# Patient Record
Sex: Female | Born: 1960
Health system: Southern US, Community
[De-identification: ages and names within clinical notes are randomized; demographics above are authoritative.]

## PROBLEM LIST (undated history)

## (undated) DIAGNOSIS — N393 Stress incontinence (female) (male): Secondary | ICD-10-CM

## (undated) DIAGNOSIS — J302 Other seasonal allergic rhinitis: Secondary | ICD-10-CM

## (undated) DIAGNOSIS — M1990 Unspecified osteoarthritis, unspecified site: Secondary | ICD-10-CM

## (undated) DIAGNOSIS — I10 Essential (primary) hypertension: Secondary | ICD-10-CM

## (undated) DIAGNOSIS — K219 Gastro-esophageal reflux disease without esophagitis: Secondary | ICD-10-CM

## (undated) HISTORY — PX: WISDOM TOOTH EXTRACTION: SHX21

## (undated) HISTORY — PX: CHOLECYSTECTOMY: SHX55

## (undated) HISTORY — PX: KNEE SURGERY: SHX244

## (undated) HISTORY — PX: CATARACT EXTRACTION, BILATERAL: SHX1313

## (undated) HISTORY — PX: NASAL SEPTUM SURGERY: SHX37

---

## 2001-09-08 ENCOUNTER — Other Ambulatory Visit: Admission: RE | Admit: 2001-09-08 | Discharge: 2001-09-08 | Payer: Self-pay | Admitting: Obstetrics & Gynecology

## 2001-09-27 ENCOUNTER — Encounter: Payer: Self-pay | Admitting: Obstetrics & Gynecology

## 2001-09-27 ENCOUNTER — Encounter: Admission: RE | Admit: 2001-09-27 | Discharge: 2001-09-27 | Payer: Self-pay | Admitting: Obstetrics & Gynecology

## 2003-06-12 ENCOUNTER — Encounter: Admission: RE | Admit: 2003-06-12 | Discharge: 2003-06-12 | Payer: Self-pay | Admitting: Obstetrics & Gynecology

## 2003-06-19 ENCOUNTER — Other Ambulatory Visit: Admission: RE | Admit: 2003-06-19 | Discharge: 2003-06-19 | Payer: Self-pay | Admitting: Obstetrics & Gynecology

## 2004-05-27 ENCOUNTER — Emergency Department: Payer: Self-pay | Admitting: Emergency Medicine

## 2005-06-16 ENCOUNTER — Encounter: Admission: RE | Admit: 2005-06-16 | Discharge: 2005-06-16 | Payer: Self-pay | Admitting: Obstetrics & Gynecology

## 2007-02-04 ENCOUNTER — Emergency Department: Payer: Self-pay | Admitting: Emergency Medicine

## 2007-05-25 ENCOUNTER — Ambulatory Visit (HOSPITAL_BASED_OUTPATIENT_CLINIC_OR_DEPARTMENT_OTHER): Admission: RE | Admit: 2007-05-25 | Discharge: 2007-05-25 | Payer: Self-pay | Admitting: Orthopedic Surgery

## 2007-06-03 ENCOUNTER — Encounter: Payer: Self-pay | Admitting: Orthopedic Surgery

## 2007-06-04 ENCOUNTER — Encounter: Payer: Self-pay | Admitting: Orthopedic Surgery

## 2007-07-04 ENCOUNTER — Encounter: Payer: Self-pay | Admitting: Orthopedic Surgery

## 2009-06-12 ENCOUNTER — Encounter: Admission: RE | Admit: 2009-06-12 | Discharge: 2009-06-12 | Payer: Self-pay | Admitting: Obstetrics & Gynecology

## 2010-12-16 NOTE — Op Note (Signed)
Karen Webster, Karen Webster              ACCOUNT NO.:  [id number]   MEDICAL RECORD NO.:  [id number]          PATIENT TYPE:  AMB   LOCATION:  [facility]                         FACILITY:  [facility]   PHYSICIAN:  Ollen Gross, M.D.    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   PROCEDURE:  Right knee arthroscopy, chondroplasty patella, lateral  retinacular release.   SURGEON:  Ollen Gross, M.D.   ASSISTANT:  None.   ANESTHESIA:  General.   ESTIMATED BLOOD LOSS:  Minimal.   DRAIN:  Hemovac x1.   COMPLICATIONS:  Stable to recovery.   CLINICAL NOTE:  Karen Webster is a 50 year old female with severe  chondromalacia patella with significant anterior knee pain.  She has had  physical therapy with slight improvement symptoms, but still has  considerable pain, crepitus and dysfunction.  She has significant  lateral tilt also.  She has failed nonoperative management and presents  now for arthroscopy, chondroplasty and lateral release.   PROCEDURE IN DETAIL:  After the successful administration of general  anesthetic, a tourniquet is placed high on the right thigh, right lower  extremity prepped and draped in the usual sterile fashion.  Standard  superomedial and inferolateral incisions made, flow cannula passed  superomedial, camera passed inferolateral.  Arthroscopic visualization  proceeds.  The undersurface of patella shows grade II and III  chondromalacia but no exposed bone.  The trochlea surprisingly looks  normal.  The medial and lateral gutters are visualized.  There are no  loose bodies.  There is some synovitis present out laterally.  Flexion  and valgus force is applied to the knee and the medial compartment is  entered.  The medial  compartment looks normal.  A spinal needle is used  to localize the inferomedial portal.  Small incision made and dilator  placed.  It is probed and everything is stable in the medial  compartment.  Intercondylar notch is visualized.  The ACL is normal.  Lateral compartment is entered and it looks normal.  I then debrided the  undersurface of patella with the shaver starting at the inferomedial  portal and then we created a superolateral portal and I completed the  debridement from the superolateral portal.  We got down to stable  cartilaginous base and there was no exposed bone.  All of the frayed  cartilage had been removed down to a good stable base.  The  superolateral portal was then our starting point for the lateral  release.  I placed the ArthroCare device through that portal to debride  some of the hypertrophic synovium, then placed the ArthroCare through  the inferolateral portal to start to release, beginning at that  superolateral portal and coursing down the lateral retinaculum to the  inferolateral portal.  I released the tissue then decreased the pressure  on the inflow in order to find any bleeding tissue and cauterized  it.  Once this is completed then we removed the arthroscopic equipment from  inferior portals and superolateral portal and closed with interrupted 4-  0 nylon.  I injected 20 mL of 25% Marcaine with epi through the inflow  cannula and then a Hemovac drain was threaded through there and the  cannula removed.  I closed the skin, but did not sew the drain in.  A  bulky sterile dressing is applied and the drain is hooked to suction.  She is then awakened and transported to recovery in stable condition.      Ollen Gross, M.D.  Electronically Signed     FA/MEDQ  D:  05/25/2007  T:  05/26/2007  Job:  [id number]

## 2011-05-13 LAB — POCT PREGNANCY, URINE
Operator id: 114531
Preg Test, Ur: NEGATIVE

## 2011-05-13 LAB — POCT I-STAT 4, (NA,K, GLUC, HGB,HCT)
Glucose, Bld: 99
HCT: 41
Hemoglobin: 13.9
Operator id: 268271
Potassium: 3.3 — ABNORMAL LOW
Sodium: 139

## 2011-05-29 ENCOUNTER — Other Ambulatory Visit: Payer: Self-pay | Admitting: Obstetrics & Gynecology

## 2011-05-29 DIAGNOSIS — N6310 Unspecified lump in the right breast, unspecified quadrant: Secondary | ICD-10-CM

## 2011-06-11 ENCOUNTER — Other Ambulatory Visit: Payer: Self-pay

## 2011-06-24 ENCOUNTER — Ambulatory Visit
Admission: RE | Admit: 2011-06-24 | Discharge: 2011-06-24 | Disposition: A | Discharge status: Home / Self Care | Payer: 59 | Source: Ambulatory Visit | Attending: Obstetrics & Gynecology | Admitting: Obstetrics & Gynecology

## 2011-06-24 DIAGNOSIS — N6310 Unspecified lump in the right breast, unspecified quadrant: Secondary | ICD-10-CM

## 2011-08-10 ENCOUNTER — Emergency Department: Payer: Self-pay | Admitting: Emergency Medicine

## 2011-08-10 LAB — CBC
HGB: 12.1 g/dL (ref 12.0–16.0)
MCHC: 32.9 g/dL (ref 32.0–36.0)
Platelet: 258 10*3/uL (ref 150–440)

## 2011-08-10 LAB — BASIC METABOLIC PANEL
Calcium, Total: 8.9 mg/dL (ref 8.5–10.1)
EGFR (Non-African Amer.): >60
Glucose: 98 mg/dL (ref 65–99)
Osmolality: 278 (ref 275–301)
Potassium: 3.8 mmol/L (ref 3.5–5.1)

## 2013-01-16 ENCOUNTER — Other Ambulatory Visit: Payer: Self-pay

## 2013-01-16 DIAGNOSIS — Z1231 Encounter for screening mammogram for malignant neoplasm of breast: Secondary | ICD-10-CM

## 2013-01-17 ENCOUNTER — Ambulatory Visit
Admission: RE | Admit: 2013-01-17 | Discharge: 2013-01-17 | Disposition: A | Discharge status: Home / Self Care | Payer: 59 | Source: Ambulatory Visit

## 2013-01-17 DIAGNOSIS — Z1231 Encounter for screening mammogram for malignant neoplasm of breast: Secondary | ICD-10-CM

## 2016-01-15 ENCOUNTER — Other Ambulatory Visit: Payer: Self-pay | Admitting: Family Medicine

## 2016-01-15 DIAGNOSIS — Z1231 Encounter for screening mammogram for malignant neoplasm of breast: Secondary | ICD-10-CM

## 2016-01-22 ENCOUNTER — Ambulatory Visit: Payer: Self-pay

## 2016-01-23 ENCOUNTER — Ambulatory Visit
Admission: RE | Admit: 2016-01-23 | Discharge: 2016-01-23 | Disposition: A | Discharge status: Home / Self Care | Payer: 59 | Source: Ambulatory Visit | Attending: Family Medicine | Admitting: Family Medicine

## 2016-01-23 DIAGNOSIS — Z1231 Encounter for screening mammogram for malignant neoplasm of breast: Secondary | ICD-10-CM

## 2016-08-03 HISTORY — PX: COLONOSCOPY: SHX174

## 2016-08-24 DIAGNOSIS — J329 Chronic sinusitis, unspecified: Secondary | ICD-10-CM | POA: Diagnosis not present

## 2016-08-24 DIAGNOSIS — R05 Cough: Secondary | ICD-10-CM | POA: Diagnosis not present

## 2016-10-16 ENCOUNTER — Other Ambulatory Visit: Payer: Self-pay | Admitting: Family Medicine

## 2016-10-16 ENCOUNTER — Ambulatory Visit
Admission: RE | Admit: 2016-10-16 | Discharge: 2016-10-16 | Disposition: A | Discharge status: Home / Self Care | Payer: 59 | Source: Ambulatory Visit | Attending: Family Medicine | Admitting: Family Medicine

## 2016-10-16 DIAGNOSIS — M25522 Pain in left elbow: Secondary | ICD-10-CM

## 2016-10-16 DIAGNOSIS — W19XXXA Unspecified fall, initial encounter: Secondary | ICD-10-CM

## 2016-10-16 DIAGNOSIS — S59902A Unspecified injury of left elbow, initial encounter: Secondary | ICD-10-CM | POA: Diagnosis not present

## 2016-10-16 DIAGNOSIS — R058 Other specified cough: Secondary | ICD-10-CM

## 2016-10-16 DIAGNOSIS — R05 Cough: Secondary | ICD-10-CM

## 2016-10-16 DIAGNOSIS — I10 Essential (primary) hypertension: Secondary | ICD-10-CM | POA: Diagnosis not present

## 2016-10-23 DIAGNOSIS — S5002XA Contusion of left elbow, initial encounter: Secondary | ICD-10-CM | POA: Diagnosis not present

## 2016-11-11 DIAGNOSIS — I10 Essential (primary) hypertension: Secondary | ICD-10-CM | POA: Diagnosis not present

## 2016-11-30 DIAGNOSIS — R197 Diarrhea, unspecified: Secondary | ICD-10-CM | POA: Diagnosis not present

## 2016-12-03 DIAGNOSIS — R197 Diarrhea, unspecified: Secondary | ICD-10-CM | POA: Diagnosis not present

## 2016-12-11 DIAGNOSIS — K529 Noninfective gastroenteritis and colitis, unspecified: Secondary | ICD-10-CM | POA: Diagnosis not present

## 2016-12-11 LAB — HM COLONOSCOPY

## 2017-01-17 DIAGNOSIS — J029 Acute pharyngitis, unspecified: Secondary | ICD-10-CM | POA: Diagnosis not present

## 2017-01-17 DIAGNOSIS — H60331 Swimmer's ear, right ear: Secondary | ICD-10-CM | POA: Diagnosis not present

## 2017-01-17 DIAGNOSIS — H6982 Other specified disorders of Eustachian tube, left ear: Secondary | ICD-10-CM | POA: Diagnosis not present

## 2017-01-18 DIAGNOSIS — J029 Acute pharyngitis, unspecified: Secondary | ICD-10-CM | POA: Diagnosis not present

## 2017-01-20 DIAGNOSIS — J329 Chronic sinusitis, unspecified: Secondary | ICD-10-CM | POA: Diagnosis not present

## 2017-01-20 DIAGNOSIS — J029 Acute pharyngitis, unspecified: Secondary | ICD-10-CM | POA: Diagnosis not present

## 2017-01-20 DIAGNOSIS — H6692 Otitis media, unspecified, left ear: Secondary | ICD-10-CM | POA: Diagnosis not present

## 2017-02-16 DIAGNOSIS — J301 Allergic rhinitis due to pollen: Secondary | ICD-10-CM | POA: Diagnosis not present

## 2017-02-16 DIAGNOSIS — H6522 Chronic serous otitis media, left ear: Secondary | ICD-10-CM | POA: Diagnosis not present

## 2017-02-16 DIAGNOSIS — H6502 Acute serous otitis media, left ear: Secondary | ICD-10-CM | POA: Diagnosis not present

## 2017-04-15 DIAGNOSIS — I10 Essential (primary) hypertension: Secondary | ICD-10-CM | POA: Diagnosis not present

## 2017-04-15 DIAGNOSIS — R410 Disorientation, unspecified: Secondary | ICD-10-CM | POA: Diagnosis not present

## 2017-05-25 DIAGNOSIS — H6691 Otitis media, unspecified, right ear: Secondary | ICD-10-CM | POA: Diagnosis not present

## 2017-05-25 DIAGNOSIS — J029 Acute pharyngitis, unspecified: Secondary | ICD-10-CM | POA: Diagnosis not present

## 2017-05-26 DIAGNOSIS — H6690 Otitis media, unspecified, unspecified ear: Secondary | ICD-10-CM | POA: Diagnosis not present

## 2017-05-26 DIAGNOSIS — R509 Fever, unspecified: Secondary | ICD-10-CM | POA: Diagnosis not present

## 2017-06-22 ENCOUNTER — Other Ambulatory Visit: Payer: Self-pay | Admitting: Family Medicine

## 2017-06-22 ENCOUNTER — Ambulatory Visit
Admission: RE | Admit: 2017-06-22 | Discharge: 2017-06-22 | Disposition: A | Discharge status: Home / Self Care | Payer: 59 | Source: Ambulatory Visit | Attending: Family Medicine | Admitting: Family Medicine

## 2017-06-22 DIAGNOSIS — R05 Cough: Secondary | ICD-10-CM

## 2017-06-22 DIAGNOSIS — R059 Cough, unspecified: Secondary | ICD-10-CM

## 2017-06-23 DIAGNOSIS — R05 Cough: Secondary | ICD-10-CM | POA: Diagnosis not present

## 2017-06-23 DIAGNOSIS — R062 Wheezing: Secondary | ICD-10-CM | POA: Diagnosis not present

## 2017-07-05 DIAGNOSIS — Z0000 Encounter for general adult medical examination without abnormal findings: Secondary | ICD-10-CM | POA: Diagnosis not present

## 2017-07-05 DIAGNOSIS — Z13220 Encounter for screening for lipoid disorders: Secondary | ICD-10-CM | POA: Diagnosis not present

## 2017-07-05 LAB — BASIC METABOLIC PANEL
BUN: 14 (ref 4–21)
Creatinine: 0.9 (ref 0.5–1.1)
Glucose: 91
Sodium: 141 (ref 137–147)

## 2017-07-22 DIAGNOSIS — M1711 Unilateral primary osteoarthritis, right knee: Secondary | ICD-10-CM | POA: Diagnosis not present

## 2017-09-07 DIAGNOSIS — Z01419 Encounter for gynecological examination (general) (routine) without abnormal findings: Secondary | ICD-10-CM | POA: Diagnosis not present

## 2017-09-07 DIAGNOSIS — Z6838 Body mass index (BMI) 38.0-38.9, adult: Secondary | ICD-10-CM | POA: Diagnosis not present

## 2017-09-14 DIAGNOSIS — T8332XA Displacement of intrauterine contraceptive device, initial encounter: Secondary | ICD-10-CM | POA: Diagnosis not present

## 2017-10-15 ENCOUNTER — Ambulatory Visit: Payer: Self-pay | Admitting: Obstetrics & Gynecology

## 2017-10-25 ENCOUNTER — Encounter: Payer: Self-pay | Admitting: Obstetrics & Gynecology

## 2017-10-26 ENCOUNTER — Ambulatory Visit: Payer: 59 | Admitting: Obstetrics & Gynecology

## 2017-10-26 ENCOUNTER — Encounter: Payer: Self-pay | Admitting: Obstetrics & Gynecology

## 2017-10-26 ENCOUNTER — Other Ambulatory Visit: Payer: Self-pay | Admitting: Obstetrics & Gynecology

## 2017-10-26 VITALS — BP 134/90 | Ht 66.0 in | Wt 242.0 lb

## 2017-10-26 DIAGNOSIS — D219 Benign neoplasm of connective and other soft tissue, unspecified: Secondary | ICD-10-CM | POA: Diagnosis not present

## 2017-10-26 DIAGNOSIS — T8332XA Displacement of intrauterine contraceptive device, initial encounter: Secondary | ICD-10-CM | POA: Diagnosis not present

## 2017-10-26 DIAGNOSIS — N393 Stress incontinence (female) (male): Secondary | ICD-10-CM | POA: Diagnosis not present

## 2017-10-26 DIAGNOSIS — Z30432 Encounter for removal of intrauterine contraceptive device: Secondary | ICD-10-CM | POA: Diagnosis not present

## 2017-10-26 DIAGNOSIS — Z780 Asymptomatic menopausal state: Secondary | ICD-10-CM | POA: Diagnosis not present

## 2017-10-26 NOTE — Patient Instructions (Signed)
1. Fibroids Known uterine fibroids, but probably stable per gynecologic exam today.  Now that she is in menopause, patient informed that the fibroids will probably progressively get smaller or at least stay stable, that they are unlikely to cause pain or bleeding and that the vast majority remain benign.  Will reassess by pelvic ultrasound at follow-up. - US Transvaginal Non-OB; Future  2. Menopause present Well on no hormone replacement therapy except for the Mirena IUD.  Will attempt removal at follow-up under ultrasound guidance.  No postmenopausal bleeding.  Declines hormone replacement therapy at this time.  3. Encounter for removal of intrauterine contraceptive device (IUD) IUD strings lost, unsuccessful attempt to remove the IUD today.  Will reattempt under ultrasound guidance at follow-up. - US Transvaginal Non-OB; Future  4. Intrauterine contraceptive device threads lost, initial encounter As above. - US Transvaginal Non-OB; Future  5. SUI (stress urinary incontinence, female) Significant stress urinary incontinence not helped after doing Kegel exercises and pelvic floor treatment with physical therapy.  Will refer to Urology to consider Sling procedure.  Karen Webster, it was a pleasure seeing you today!  I will see you again soon for your pelvic US/IUD removal.

## 2017-10-26 NOTE — Progress Notes (Addendum)
Karen Webster November 19, 1960 [id number]        57 y.o.  G1P1 Long term boyfriend  RP: Fibroids and Mirena IUD removal  HPI: Patient is now menopausal with mild hot flashes and night sweats.  Not on any hormone replacement therapy, but still has the Mirena IUD in place which was inserted in October 2014.  The strings were not visible at the cervix on the last gynecologic exam at [facility].  Patient has no postmenopausal bleeding.  No pelvic pain.  Normal vaginal secretions.  History of fibroids with her last pelvic ultrasound at [facility] recently in 2019: Retroverted uterus measuring 8.4 x 7 x 4.6 cm.  2 Intramural fibroids measuring 5 cm and 2 cm.  Poorly seen endometrium due to fibroids displacing the cavity.  IUD seen of in the cavity but displaced by fibroids which are partially submucosal.  Ovaries difficult to visualize.  Patient also complains of severe stress urinary incontinence happening with sneezing, cough, physical activity but also very easily when changing position.  No urgency incontinence.  Patient has tried Kegel exercises and went to physical therapy for pelvic floor reinforcement without improvement.   OB History  Gravida Para Term Preterm AB Living  1 1       1   SAB TAB Ectopic Multiple Live Births               # Outcome Date GA Lbr Len/2nd Weight Sex Delivery Anes PTL Lv  1 Para             Past medical history,surgical history, problem list, medications, allergies, family history and social history were all reviewed and documented in the EPIC chart.   Directed ROS with pertinent positives and negatives documented in the history of present illness/assessment and plan.  Exam:  Vitals:   10/26/17 0832  BP: 134/90  Weight: 242 lb (109.8 kg)  Height: 5\' 6"  (1.676 m)   General appearance:  Normal  Abdomen: Soft, NT  Gynecologic exam: Vulva normal.  Speculum:  Cervix/Vagina normal.  Normal vaginal secretions.  IUD strings not visible.  Bimanual exam: Retroverted  uterus, nodular but not very enlarged, mobile, nontender.  No adnexal mass felt.  No uterine prolapse, no cystocele or rectocele.  Attempted IUD removal: Betadine prep on the cervix.  Hurricane spray on the cervix.  Tenaculum on the anterior lip of the cervix.  Attempted removal of the IUD with [place] clamps in the endocervix.  Os finder used to mildly dilated the cervix.  Hook instrument attempted in the endocervix and then [facility] clamp again without success.  Decision to stop at this point and reattempt IUD removal under ultrasound guidance.   Assessment/Plan:  57 y.o. G1P1   1. Fibroids Known uterine fibroids, but probably stable per gynecologic exam today.  Now that she is in menopause, patient informed that the fibroids will probably progressively get smaller or at least stay stable, that they are unlikely to cause pain or bleeding and that the vast majority remain benign.  Will reassess by pelvic ultrasound at follow-up. - US Transvaginal Non-OB; Future  2. Menopause present Well on no hormone replacement therapy except for the Mirena IUD.  Will attempt removal at follow-up under ultrasound guidance.  No postmenopausal bleeding.  Declines hormone replacement therapy at this time.  3. Encounter for removal of intrauterine contraceptive device (IUD) IUD strings lost, unsuccessful attempt to remove the IUD today.  Will reattempt under ultrasound guidance at follow-up. - US Transvaginal Non-OB; Future  4. Intrauterine contraceptive device threads lost, initial encounter As above. - US Transvaginal Non-OB; Future  5. SUI (stress urinary incontinence, female) Significant stress urinary incontinence not helped after doing Kegel exercises and pelvic floor treatment with physical therapy.  Will refer to Urology to consider Sling procedure.  Counseling and coordination of care more than 50% for 25 minutes.  Princess Bruins MD, 8:45 AM 10/26/2017

## 2017-10-28 ENCOUNTER — Telehealth: Payer: Self-pay | Admitting: *Deleted

## 2017-10-28 NOTE — Telephone Encounter (Signed)
-----   Message from Princess Bruins, MD sent at 10/26/2017  6:02 PM EDT ----- Regarding: Referral to Urology Stress Urinary Incontinence not improved with Kegels and PT.

## 2017-10-28 NOTE — Telephone Encounter (Signed)
Notes faxed to urology they will contact pt to schedule and fax me back with time and date.

## 2017-11-02 ENCOUNTER — Ambulatory Visit: Payer: 59 | Admitting: Obstetrics & Gynecology

## 2017-11-02 ENCOUNTER — Telehealth: Payer: Self-pay

## 2017-11-02 ENCOUNTER — Ambulatory Visit (INDEPENDENT_AMBULATORY_CARE_PROVIDER_SITE_OTHER): Payer: 59

## 2017-11-02 ENCOUNTER — Other Ambulatory Visit: Payer: Self-pay | Admitting: Obstetrics & Gynecology

## 2017-11-02 ENCOUNTER — Encounter: Payer: Self-pay | Admitting: Obstetrics & Gynecology

## 2017-11-02 DIAGNOSIS — D219 Benign neoplasm of connective and other soft tissue, unspecified: Secondary | ICD-10-CM

## 2017-11-02 DIAGNOSIS — D251 Intramural leiomyoma of uterus: Secondary | ICD-10-CM | POA: Diagnosis not present

## 2017-11-02 DIAGNOSIS — Z30432 Encounter for removal of intrauterine contraceptive device: Secondary | ICD-10-CM | POA: Diagnosis not present

## 2017-11-02 NOTE — Progress Notes (Signed)
Karen Webster 10-08-1960 [id number]        57 y.o.  G1P1 Long term boyfriend  RP: Removal of IUD under US guidance  HPI:  IUD strings lost.  Fibroids.  Menopause, well on no HRT.  No PMB.  Unsuccessful attempt at removing the IUD last visit.  No pelvic pain.  Normal vaginal secretions.     OB History  Gravida Para Term Preterm AB Living  1 1       1   SAB TAB Ectopic Multiple Live Births               # Outcome Date GA Lbr Len/2nd Weight Sex Delivery Anes PTL Lv  1 Para             Past medical history,surgical history, problem list, medications, allergies, family history and social history were all reviewed and documented in the EPIC chart.   Directed ROS with pertinent positives and negatives documented in the history of present illness/assessment and plan.  Exam:  There were no vitals filed for this visit. General appearance:  Normal  Pelvic US: T/V and T/A images.  Retroverted uterus with intramural and submucosal fibroids displacing the endometrium.  IUD seen in the intrauterine cavity.  Fibroids: Calcified wall measuring 7.0 x 5.6 cm, 2.1 x 1.9 cm, 2.5 x 2.2 cm, 1.4 x 1.1 cm.  Right and left ovaries seen and normal.  No free fluid in the posterior cul-de-sac.                                                                    IUD removal procedure note       Patient presented to the office today for removal of Mirena IUD.   The cervix was cleansed with Betadine solution. Hurricane spray on the cervix.  A single-tooth tenaculum was placed on the anterior cervical lip.  IUD strings not visible at the exocervix.  Dilation of the cervix with the os finder.  Attempt to bring the IUD strings down with the hook.  Then attempts to grasp the strings or the IUD with a Bozeman clamp.  Difficult to be guided by the ultrasound trans-abdominally given the fibroids.  We attempted to be guided by ultrasound with the endovaginal probe while the instruments in place but the speculum  removed.  Visualization was better that way but showed [name] that one of the fibroid appeared mostly submucosal and it was difficult to go towards the IUD with the [facility] clamp because the fibroid was in the way.  Patient had severe cramping during the attempted removal of the IUD and the fibroids made it technically difficult, we therefore interrupted the attempts at that point and decided to proceed under hysteroscopy instead at a later time.  All instruments were removed.  We confirmed intrauterine location of the IUD by ultrasound at the end of the unsuccessful IUD removal.     Assessment/Plan:  57 y.o. G1P1   1. Encounter for removal of intrauterine contraceptive device (IUD) Attempted removal of IUD under ultrasound guidance.  Procedure impeded by a large submucosal fibroid.  Unsuccessful removal of the IUD.  Decision to proceed with IUD removal under hysteroscopy.  Plan discussed with patient and agreed on.  Surgery including preop,  procedure, risks and benefits, as well as postop reviewed thoroughly with patient.  Pamphlet on hysteroscopy given.  Will organize surgery as soon as possible.  Follow-up preop as needed after reading the information. - US Transvaginal Non-OB and Transabdominal  2. Intrauterine contraceptive device threads lost, initial encounter As above. - US Transvaginal Non-OB  3. Fibroids Intramural and submucosal uterine fibroids. - US Transvaginal Non-OB  Princess Bruins MD, 1:13 PM 11/02/2017

## 2017-11-02 NOTE — Telephone Encounter (Signed)
I called patient to schedule surgery. Discussed insurance benefits and estimated [facility] surgery prepymt due by one week before surgery.  Patient expressed that she would not be able to pay this in full. I offered to talk with my Freight forwarder. I will call her back tomorrow after consulting with Sharrie Rothman.

## 2017-11-03 ENCOUNTER — Encounter: Payer: Self-pay | Admitting: Obstetrics & Gynecology

## 2017-11-03 NOTE — Patient Instructions (Signed)
1. Encounter for removal of intrauterine contraceptive device (IUD) Attempted removal of IUD under ultrasound guidance.  Procedure impeded by a large submucosal fibroid.  Unsuccessful removal of the IUD.  Decision to proceed with IUD removal under hysteroscopy.  Plan discussed with patient and agreed on.  Surgery including preop, procedure, risks and benefits, as well as postop reviewed thoroughly with patient.  Pamphlet on hysteroscopy given.  Will organize surgery as soon as possible.  Follow-up preop as needed after reading the information. - US Transvaginal Non-OB and Transabdominal  2. Intrauterine contraceptive device threads lost, initial encounter As above. - US Transvaginal Non-OB  3. Fibroids Intramural and submucosal uterine fibroids. - US Transvaginal Non-OB  Loreen, I am organizing the procedure to remove your IUD under hysteroscopy.  You will receive a phone call from my office to set a date and time.  Hysteroscopy Hysteroscopy is a procedure used for looking inside the womb (uterus). It may be done for various reasons, including:  To evaluate abnormal bleeding, fibroid (benign, noncancerous) tumors, polyps, scar tissue (adhesions), and possibly cancer of the uterus.  To look for lumps (tumors) and other uterine growths.  To look for causes of why a woman cannot get pregnant (infertility), causes of recurrent loss of pregnancy (miscarriages), or a lost intrauterine device (IUD).  To perform a sterilization by blocking the fallopian tubes from inside the uterus.  In this procedure, a thin, flexible tube with a tiny light and camera on the end of it (hysteroscope) is used to look inside the uterus. A hysteroscopy should be done right after a menstrual period to be sure you are not pregnant. LET [facility] CARE PROVIDER KNOW ABOUT:  Any allergies you have.  All medicines you are taking, including vitamins, herbs, eye drops, creams, and over-the-counter  medicines.  Previous problems you or members of your family have had with the use of anesthetics.  Any blood disorders you have.  Previous surgeries you have had.  Medical conditions you have. RISKS AND COMPLICATIONS Generally, this is a safe procedure. However, as with any procedure, complications can occur. Possible complications include:  Putting a hole in the uterus.  Excessive bleeding.  Infection.  Damage to the cervix.  Injury to other organs.  Allergic reaction to medicines.  Too much fluid used in the uterus for the procedure.  BEFORE THE PROCEDURE  Ask your health care provider about changing or stopping any regular medicines.  Do not take aspirin or blood thinners for 1 week before the procedure, or as directed by your health care provider. These can cause bleeding.  If you smoke, do not smoke for 2 weeks before the procedure.  In some cases, a medicine is placed in the cervix the day before the procedure. This medicine makes the cervix have a larger opening (dilate). This makes it easier for the instrument to be inserted into the uterus during the procedure.  Do not eat or drink anything for at least 8 hours before the surgery.  Arrange for someone to take you home after the procedure. PROCEDURE  You may be given a medicine to relax you (sedative). You may also be given one of the following: ? A medicine that numbs the area around the cervix (local anesthetic). ? A medicine that makes you sleep through the procedure (general anesthetic).  The hysteroscope is inserted through the vagina into the uterus. The camera on the hysteroscope sends a picture to a TV screen. This gives the surgeon a good view inside  the uterus.  During the procedure, air or a liquid is put into the uterus, which allows the surgeon to see better.  Sometimes, tissue is gently scraped from inside the uterus. These tissue samples are sent to a lab for testing. What to expect after the  procedure  If you had a general anesthetic, you may be groggy for a couple hours after the procedure.  If you had a local anesthetic, you will be able to go home as soon as you are stable and feel ready.  You may have some cramping. This normally lasts for a couple days.  You may have bleeding, which varies from light spotting for a few days to menstrual-like bleeding for 3-7 days. This is normal.  If your test results are not back during the visit, make an appointment with your health care provider to find out the results. This information is not intended to replace advice given to you by your health care provider. Make sure you discuss any questions you have with your health care provider. Document Released: 10/26/2000 Document Revised: 12/26/2015 Document Reviewed: 02/16/2013 Elsevier Interactive Patient Education  2017 [employer].

## 2017-11-03 NOTE — Telephone Encounter (Signed)
I called patient and let her know I spoke with my manager about her financial prepayment. Since Dr. Dellis Filbert advised clinic manager that  procedure is not considered an emergency and no urgency to having this IUD removed my clinic manager would like for patient to wait to schedule procedure until she can be responsible for her estimated surgery prepaymt. Patient was advised. She will call me back when she is ready to schedule.

## 2017-11-09 ENCOUNTER — Encounter (HOSPITAL_BASED_OUTPATIENT_CLINIC_OR_DEPARTMENT_OTHER): Payer: Self-pay

## 2017-11-09 ENCOUNTER — Other Ambulatory Visit: Payer: Self-pay

## 2017-11-09 NOTE — Progress Notes (Signed)
Spoke with:  Neoma Laming NPO:  After Midnight, no gum, candy, or mints   Arrival time: 6:30AM Labs: Istat 8, EKG AM medications: Losartan, Omeprazole Pre op orders:  No Ride home: Shaune Spittle [phone]

## 2017-11-11 ENCOUNTER — Encounter: Payer: Self-pay | Admitting: Anesthesiology

## 2017-11-12 NOTE — Telephone Encounter (Signed)
Pt scheduled on 12/10/17 @ 8:45am with Dr.MacDiarmid

## 2017-11-16 NOTE — Anesthesia Preprocedure Evaluation (Addendum)
Anesthesia Evaluation  Patient identified by MRN, date of birth, ID band Patient awake    Reviewed: Allergy & Precautions, NPO status , Patient's Chart, lab work & pertinent test results  History of Anesthesia Complications Negative for: history of anesthetic complications  Airway Mallampati: II  TM Distance: >3 FB Neck ROM: Full    Dental no notable dental hx. (+) Dental Advisory Given   Pulmonary neg pulmonary ROS,    Pulmonary exam normal        Cardiovascular hypertension, Pt. on medications Normal cardiovascular exam     Neuro/Psych negative neurological ROS     GI/Hepatic negative GI ROS, Neg liver ROS, GERD  ,  Endo/Other  negative endocrine ROSMorbid obesity  Renal/GU negative Renal ROS     Musculoskeletal negative musculoskeletal ROS (+)   Abdominal   Peds  Hematology negative hematology ROS (+)   Anesthesia Other Findings Day of surgery medications reviewed with the patient.  Reproductive/Obstetrics                            Anesthesia Physical Anesthesia Plan  ASA: II  Anesthesia Plan: General   Post-op Pain Management:    Induction: Intravenous  PONV Risk Score and Plan: 3 and Ondansetron, Dexamethasone and Scopolamine patch - Pre-op  Airway Management Planned: LMA  Additional Equipment:   Intra-op Plan:   Post-operative Plan: Extubation in OR  Informed Consent: I have reviewed the patients History and Physical, chart, labs and discussed the procedure including the risks, benefits and alternatives for the proposed anesthesia with the patient or authorized representative who has indicated his/her understanding and acceptance.   Dental advisory given  Plan Discussed with: CRNA and Anesthesiologist  Anesthesia Plan Comments:        Anesthesia Quick Evaluation

## 2017-11-17 ENCOUNTER — Ambulatory Visit (HOSPITAL_BASED_OUTPATIENT_CLINIC_OR_DEPARTMENT_OTHER): Payer: 59 | Admitting: Anesthesiology

## 2017-11-17 ENCOUNTER — Encounter (HOSPITAL_BASED_OUTPATIENT_CLINIC_OR_DEPARTMENT_OTHER): Payer: Self-pay | Admitting: Anesthesiology

## 2017-11-17 ENCOUNTER — Other Ambulatory Visit: Payer: Self-pay

## 2017-11-17 ENCOUNTER — Ambulatory Visit (HOSPITAL_BASED_OUTPATIENT_CLINIC_OR_DEPARTMENT_OTHER)
Admission: RE | Admit: 2017-11-17 | Discharge: 2017-11-17 | Disposition: A | Discharge status: Home / Self Care | Payer: 59 | Source: Ambulatory Visit | Attending: Obstetrics & Gynecology | Admitting: Obstetrics & Gynecology

## 2017-11-17 ENCOUNTER — Encounter (HOSPITAL_BASED_OUTPATIENT_CLINIC_OR_DEPARTMENT_OTHER)
Admission: RE | Disposition: A | Discharge status: Home / Self Care | Payer: Self-pay | Source: Ambulatory Visit | Attending: Obstetrics & Gynecology

## 2017-11-17 DIAGNOSIS — Z79899 Other long term (current) drug therapy: Secondary | ICD-10-CM | POA: Insufficient documentation

## 2017-11-17 DIAGNOSIS — D250 Submucous leiomyoma of uterus: Secondary | ICD-10-CM | POA: Insufficient documentation

## 2017-11-17 DIAGNOSIS — Z30432 Encounter for removal of intrauterine contraceptive device: Secondary | ICD-10-CM | POA: Insufficient documentation

## 2017-11-17 DIAGNOSIS — Z791 Long term (current) use of non-steroidal anti-inflammatories (NSAID): Secondary | ICD-10-CM | POA: Diagnosis not present

## 2017-11-17 DIAGNOSIS — Z6837 Body mass index (BMI) 37.0-37.9, adult: Secondary | ICD-10-CM | POA: Insufficient documentation

## 2017-11-17 DIAGNOSIS — D259 Leiomyoma of uterus, unspecified: Secondary | ICD-10-CM | POA: Diagnosis not present

## 2017-11-17 DIAGNOSIS — D251 Intramural leiomyoma of uterus: Secondary | ICD-10-CM | POA: Diagnosis not present

## 2017-11-17 DIAGNOSIS — I10 Essential (primary) hypertension: Secondary | ICD-10-CM | POA: Insufficient documentation

## 2017-11-17 DIAGNOSIS — T8339XA Other mechanical complication of intrauterine contraceptive device, initial encounter: Secondary | ICD-10-CM | POA: Diagnosis not present

## 2017-11-17 HISTORY — DX: Unspecified osteoarthritis, unspecified site: M19.90

## 2017-11-17 HISTORY — DX: Gastro-esophageal reflux disease without esophagitis: K21.9

## 2017-11-17 HISTORY — DX: Other seasonal allergic rhinitis: J30.2

## 2017-11-17 HISTORY — DX: Stress incontinence (female) (male): N39.3

## 2017-11-17 HISTORY — DX: Essential (primary) hypertension: I10

## 2017-11-17 HISTORY — PX: HYSTEROSCOPY: SHX211

## 2017-11-17 LAB — CBC
HCT: 41.6 % (ref 36.0–46.0)
Hemoglobin: 13.6 g/dL (ref 12.0–15.0)
MCH: 30.4 pg (ref 26.0–34.0)
MCHC: 32.7 g/dL (ref 30.0–36.0)
MCV: 92.9 fL (ref 78.0–100.0)
PLATELETS: 281 10*3/uL (ref 150–400)
RBC: 4.48 MIL/uL (ref 3.87–5.11)
RDW: 13 % (ref 11.5–15.5)
WBC: 6.1 10*3/uL (ref 4.0–10.5)

## 2017-11-17 LAB — POCT I-STAT, CHEM 8
BUN: 11 mg/dL (ref 6–20)
CALCIUM ION: 1.18 mmol/L (ref 1.15–1.40)
Chloride: 100 mmol/L — ABNORMAL LOW (ref 101–111)
Creatinine, Ser: 0.8 mg/dL (ref 0.44–1.00)
Glucose, Bld: 105 mg/dL — ABNORMAL HIGH (ref 65–99)
HCT: 39 % (ref 36.0–46.0)
HEMOGLOBIN: 13.3 g/dL (ref 12.0–15.0)
Potassium: 3.6 mmol/L (ref 3.5–5.1)
SODIUM: 142 mmol/L (ref 135–145)
TCO2: 31 mmol/L (ref 22–32)

## 2017-11-17 LAB — POCT PREGNANCY, URINE: PREG TEST UR: NEGATIVE

## 2017-11-17 SURGERY — HYSTEROSCOPY
Anesthesia: General | Site: Uterus

## 2017-11-17 MED ORDER — KETOROLAC TROMETHAMINE 30 MG/ML IJ SOLN
INTRAMUSCULAR | Status: DC | PRN
  Administered 2017-11-17: 30 mg via INTRAVENOUS

## 2017-11-17 MED ORDER — SCOPOLAMINE 1 MG/3DAYS TD PT72
1.0000 | MEDICATED_PATCH | TRANSDERMAL | Status: DC
  Administered 2017-11-17: 1.5 mg via TRANSDERMAL
  Filled 2017-11-17: qty 1

## 2017-11-17 MED ORDER — ONDANSETRON HCL 4 MG/2ML IJ SOLN
INTRAMUSCULAR | Status: AC
  Filled 2017-11-17: qty 2

## 2017-11-17 MED ORDER — LIDOCAINE 2% (20 MG/ML) 5 ML SYRINGE
INTRAMUSCULAR | Status: DC | PRN
  Administered 2017-11-17: 100 mg via INTRAVENOUS

## 2017-11-17 MED ORDER — LACTATED RINGERS IV SOLN
INTRAVENOUS | Status: DC
  Filled 2017-11-17: qty 1000

## 2017-11-17 MED ORDER — FENTANYL CITRATE (PF) 100 MCG/2ML IJ SOLN
25.0000 ug | INTRAMUSCULAR | Status: DC | PRN
  Filled 2017-11-17: qty 1

## 2017-11-17 MED ORDER — MIDAZOLAM HCL 2 MG/2ML IJ SOLN
INTRAMUSCULAR | Status: AC
  Filled 2017-11-17: qty 2

## 2017-11-17 MED ORDER — MIDAZOLAM HCL 5 MG/5ML IJ SOLN
INTRAMUSCULAR | Status: DC | PRN
  Administered 2017-11-17: 2 mg via INTRAVENOUS

## 2017-11-17 MED ORDER — FENTANYL CITRATE (PF) 100 MCG/2ML IJ SOLN
INTRAMUSCULAR | Status: DC | PRN
  Administered 2017-11-17: 50 ug via INTRAVENOUS

## 2017-11-17 MED ORDER — PROPOFOL 10 MG/ML IV BOLUS
INTRAVENOUS | Status: AC
Start: 2017-11-17 — End: ?
  Filled 2017-11-17: qty 40

## 2017-11-17 MED ORDER — ONDANSETRON HCL 4 MG/2ML IJ SOLN
INTRAMUSCULAR | Status: DC | PRN
  Administered 2017-11-17: 4 mg via INTRAVENOUS

## 2017-11-17 MED ORDER — CEFAZOLIN SODIUM-DEXTROSE 2-4 GM/100ML-% IV SOLN
INTRAVENOUS | Status: AC
  Filled 2017-11-17: qty 100

## 2017-11-17 MED ORDER — SCOPOLAMINE 1 MG/3DAYS TD PT72
MEDICATED_PATCH | TRANSDERMAL | Status: AC
  Filled 2017-11-17: qty 1

## 2017-11-17 MED ORDER — SODIUM CHLORIDE 0.9 % IR SOLN
Status: DC | PRN
  Administered 2017-11-17: 3000 mL

## 2017-11-17 MED ORDER — DEXAMETHASONE SODIUM PHOSPHATE 10 MG/ML IJ SOLN
INTRAMUSCULAR | Status: DC | PRN
  Administered 2017-11-17: 10 mg via INTRAVENOUS

## 2017-11-17 MED ORDER — FENTANYL CITRATE (PF) 100 MCG/2ML IJ SOLN
INTRAMUSCULAR | Status: AC
  Filled 2017-11-17: qty 2

## 2017-11-17 MED ORDER — LACTATED RINGERS IV SOLN
INTRAVENOUS | Status: DC
  Administered 2017-11-17: 08:00:00 via INTRAVENOUS
  Filled 2017-11-17: qty 1000

## 2017-11-17 MED ORDER — CEFAZOLIN SODIUM-DEXTROSE 2-4 GM/100ML-% IV SOLN
2.0000 g | INTRAVENOUS | Status: AC
  Administered 2017-11-17: 2 g via INTRAVENOUS
  Filled 2017-11-17: qty 100

## 2017-11-17 MED ORDER — KETOROLAC TROMETHAMINE 30 MG/ML IJ SOLN
INTRAMUSCULAR | Status: AC
  Filled 2017-11-17: qty 1

## 2017-11-17 MED ORDER — DEXAMETHASONE SODIUM PHOSPHATE 10 MG/ML IJ SOLN
INTRAMUSCULAR | Status: AC
  Filled 2017-11-17: qty 1

## 2017-11-17 MED ORDER — PROPOFOL 10 MG/ML IV BOLUS
INTRAVENOUS | Status: DC | PRN
  Administered 2017-11-17: 200 mg via INTRAVENOUS

## 2017-11-17 MED ORDER — CHLOROPROCAINE HCL 1 % IJ SOLN
INTRAMUSCULAR | Status: DC | PRN
  Administered 2017-11-17: 10 mL

## 2017-11-17 MED ORDER — PROMETHAZINE HCL 25 MG/ML IJ SOLN
6.2500 mg | INTRAMUSCULAR | Status: DC | PRN
  Filled 2017-11-17: qty 1

## 2017-11-17 MED ORDER — LIDOCAINE 2% (20 MG/ML) 5 ML SYRINGE
INTRAMUSCULAR | Status: AC
Start: 2017-11-17 — End: ?
  Filled 2017-11-17: qty 5

## 2017-11-17 SURGICAL SUPPLY — 23 items
BIPOLAR CUTTING LOOP 21FR (ELECTRODE)
CANISTER SUCT 3000ML PPV (MISCELLANEOUS) ×2 IMPLANT
CATH ROBINSON RED A/P 16FR (CATHETERS) ×2 IMPLANT
COUNTER NEEDLE 1200 MAGNETIC (NEEDLE) ×2 IMPLANT
DILATOR CANAL MILEX (MISCELLANEOUS) IMPLANT
ELECT REM PT RETURN 9FT ADLT (ELECTROSURGICAL)
ELECTRODE REM PT RTRN 9FT ADLT (ELECTROSURGICAL) IMPLANT
GLOVE BIO SURGEON STRL SZ 6.5 (GLOVE) ×2 IMPLANT
GLOVE BIOGEL PI IND STRL 7.0 (GLOVE) ×1 IMPLANT
GLOVE BIOGEL PI INDICATOR 7.0 (GLOVE) ×1
GOWN STRL REUS W/TWL LRG LVL3 (GOWN DISPOSABLE) ×1 IMPLANT
GOWN STRL REUS W/TWL XL LVL3 (GOWN DISPOSABLE) ×1 IMPLANT
IV NS IRRIG 3000ML ARTHROMATIC (IV SOLUTION) ×2 IMPLANT
KIT TURNOVER CYSTO (KITS) ×2 IMPLANT
LOOP CUTTING BIPOLAR 21FR (ELECTRODE) IMPLANT
NEEDLE HYPO 22GX1.5 SAFETY (NEEDLE) ×1 IMPLANT
PACK VAGINAL MINOR WOMEN LF (CUSTOM PROCEDURE TRAY) ×2 IMPLANT
PAD OB MATERNITY 4.3X12.25 (PERSONAL CARE ITEMS) ×2 IMPLANT
SYR CONTROL 10ML LL (SYRINGE) ×1 IMPLANT
TOWEL OR 17X24 6PK STRL BLUE (TOWEL DISPOSABLE) ×4 IMPLANT
TUBING AQUILEX INFLOW (TUBING) ×2 IMPLANT
TUBING AQUILEX OUTFLOW (TUBING) ×2 IMPLANT
WATER STERILE IRR 500ML POUR (IV SOLUTION) ×1 IMPLANT

## 2017-11-17 NOTE — Discharge Instructions (Addendum)
Hysteroscopy, Care After °Refer to this sheet in the next few weeks. These instructions provide you with information on caring for yourself after your procedure. Your health care provider may also give you more specific instructions. Your treatment has been planned according to current medical practices, but problems sometimes occur. Call your health care provider if you have any problems or questions after your procedure. °What can I expect after the procedure? °After your procedure, it is typical to have the following: °· You may have some cramping. This normally lasts for a couple days. °· You may have bleeding. This can vary from light spotting for a few days to menstrual-like bleeding for 3-7 days. ° °Follow these instructions at home: °· Rest for the first 1-2 days after the procedure. °· Only take over-the-counter or prescription medicines as directed by your health care provider. Do not take aspirin. It can increase the chances of bleeding. °· Take showers instead of baths for 2 weeks or as directed by your health care provider. °· Do not drive for 24 hours or as directed. °· Do not drink alcohol while taking pain medicine. °· Do not use tampons, douche, or have sexual intercourse for 2 weeks or until your health care provider says it is okay. °· Take your temperature twice a day for 4-5 days. Write it down each time. °· Follow your health care provider's advice about diet, exercise, and lifting. °· If you develop constipation, you may: °? Take a mild laxative if your health care provider approves. °? Add bran foods to your diet. °? Drink enough fluids to keep your urine clear or pale yellow. °· Try to have someone with you or available to you for the first 24-48 hours, especially if you were given a general anesthetic. °· Follow up with your health care provider as directed. °Contact a health care provider if: °· You feel dizzy or lightheaded. °· You feel sick to your stomach (nauseous). °· You have  abnormal vaginal discharge. °· You have a rash. °· You have pain that is not controlled with medicine. °Get help right away if: °· You have bleeding that is heavier than a normal menstrual period. °· You have a fever. °· You have increasing cramps or pain, not controlled with medicine. °· You have new belly (abdominal) pain. °· You pass out. °· You have pain in the tops of your shoulders (shoulder strap areas). °· You have shortness of breath. °This information is not intended to replace advice given to you by your health care provider. Make sure you discuss any questions you have with your health care provider. °Document Released: 05/10/2013 Document Revised: 12/26/2015 Document Reviewed: 02/16/2013 °Elsevier Interactive Patient Education © 2017 [employer] ° ° °Post Anesthesia Home Care Instructions ° °Activity: °Get plenty of rest for the remainder of the day. A responsible individual must stay with you for 24 hours following the procedure.  °For the next 24 hours, DO NOT: °-Drive a car °-Operate machinery °-Drink alcoholic beverages °-Take any medication unless instructed by your physician °-Make any legal decisions or sign important papers. ° °Meals: °Start with liquid foods such as gelatin or soup. Progress to regular foods as tolerated. Avoid greasy, spicy, heavy foods. If nausea and/or vomiting occur, drink only clear liquids until the nausea and/or vomiting subsides. Call your physician if vomiting continues. ° °Special Instructions/Symptoms: °Your throat may feel dry or sore from the anesthesia or the breathing tube placed in your throat during surgery. If this causes discomfort, gargle   with warm salt water. The discomfort should disappear within 24 hours. ° °If you had a scopolamine patch placed behind your ear for the management of post- operative nausea and/or vomiting: ° °1. The medication in the patch is effective for 72 hours, after which it should be removed.  Wrap patch in a tissue and discard in  the trash. Wash hands thoroughly with soap and water. °2. You may remove the patch earlier than 72 hours if you experience unpleasant side effects which may include dry mouth, dizziness or visual disturbances. °3. Avoid touching the patch. Wash your hands with soap and water after contact with the patch. °  ° ° °

## 2017-11-17 NOTE — Anesthesia Postprocedure Evaluation (Signed)
Anesthesia Post Note  Patient: Karen Webster  Procedure(s) Performed: HYSTEROSCOPY FOR REMOVAL OF IUD (N/A Uterus)     Patient location during evaluation: PACU Anesthesia Type: General Level of consciousness: sedated Pain management: pain level controlled Vital Signs Assessment: post-procedure vital signs reviewed and stable Respiratory status: spontaneous breathing and respiratory function stable Cardiovascular status: stable Postop Assessment: no apparent nausea or vomiting Anesthetic complications: no    Last Vitals:  Vitals:   11/17/17 1015 11/17/17 1105  BP: 118/69 (!) 141/80  Pulse: 75 65  Resp: 13 16  Temp:  36.6 C  SpO2: 94% 97%    Last Pain:  Vitals:   11/17/17 1105  TempSrc: Oral  PainSc:                  Cindy Brindisi DANIEL

## 2017-11-17 NOTE — Op Note (Signed)
Operative Note  11/17/2017  9:02 AM  PATIENT:  Karen Webster  57 y.o. female  PRE-OPERATIVE DIAGNOSIS:  Unsuccessful attempt to remove the IUD under u/s guidance, uterine fibroids  POST-OPERATIVE DIAGNOSIS:  Unsuccessful attempt to remove the IUD under u/s guidance, uterine fibroids  PROCEDURE:  Procedure(s): HYSTEROSCOPY FOR REMOVAL OF IUD  SURGEON:  Surgeon(s): Princess Bruins, MD  ANESTHESIA:   general  FINDINGS: IUD in the intrauterine cavity.  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  DESCRIPTION OF OPERATION: Under general anesthesia with laryngeal mask, the patient is in lithotomy position.  She is prepped with Betadine on the suprapubic, vulvar and vaginal areas.  The bladder is catheterized.  The patient is draped as usual.  The timeout is done.  The vaginal exam reveals a retroverted uterus increased in volume with fibroids, no adnexal mass.  The speculum is inserted in the vagina.  The anterior lip of the cervix is grasped with a tenaculum.  A paracervical block is done with Nesacaine 1% a total of 20 cc at 4 and 8:00.  Dilation of the cervix with Pratt dilators up to #27 without difficulty.  The hysteroscope was inserted in the intrauterine cavity.  The cavity is distorted by a large partially submucosal myoma.  The IUD is visualized in the intrauterine cavity.  We inserted a small hysteroscopy clamp to grasp the strings under direct vision.  The IUD is easily removed without any difficulty.  The IUD is inspected and it is intact and complete.  Pictures are taken of the IUD before discarding it.  The hysteroscope was reinserted in the intrauterine cavity to take more pictures.  The instrument is then removed.  The tenaculum was removed from the cervix.  The speculum is removed.  The patient is brought to recovery room in good and stable status.  ESTIMATED BLOOD LOSS: 10 mL  FLUID DEFICIT: 300 mL  Intake/Output Summary (Last 24 hours) at 11/17/2017 0902 Last  data filed at 11/17/2017 0853 Gross per 24 hour  Intake 350 ml  Output 60 ml  Net 290 ml     BLOOD ADMINISTERED:none   LOCAL MEDICATIONS USED:  Nesacaine 1% 20 cc for Paracervical block  SPECIMEN:  Source of Specimen:  Intact, complete IUD  DISPOSITION OF SPECIMEN:  Discarded after taking a picture of it  COUNTS:  YES  PLAN OF CARE: Transfer to PACU  Karen LavoieMD9:02 AM

## 2017-11-17 NOTE — Transfer of Care (Signed)
Immediate Anesthesia Transfer of Care Note  Patient: Karen Webster  Procedure(s) Performed: HYSTEROSCOPY FOR REMOVAL OF IUD (N/A Uterus)  Patient Location: PACU  Anesthesia Type:General  Level of Consciousness: awake, alert  and oriented  Airway & Oxygen Therapy: Patient Spontanous Breathing and Patient connected to nasal cannula oxygen  Post-op Assessment: Report given to RN  Post vital signs: Reviewed and stable  Last Vitals: 136/78 Vitals Value Taken Time  BP    Temp    Pulse 76 11/17/2017  9:03 AM  Resp 18 11/17/2017  9:03 AM  SpO2 100 % 11/17/2017  9:03 AM  Vitals shown include unvalidated device data.  Last Pain:  Vitals:   11/17/17 0725  TempSrc:   PainSc: 0-No pain      Patients Stated Pain Goal: 6 (50/56/97 9480)  Complications: No apparent anesthesia complications

## 2017-11-17 NOTE — Anesthesia Procedure Notes (Signed)
Procedure Name: LMA Insertion Date/Time: 11/17/2017 8:31 AM Performed by: Bonney Aid, CRNA Pre-anesthesia Checklist: Patient identified, Emergency Drugs available, Suction available and Patient being monitored Patient Re-evaluated:Patient Re-evaluated prior to induction Oxygen Delivery Method: Circle system utilized Preoxygenation: Pre-oxygenation with 100% oxygen Induction Type: IV induction Ventilation: Mask ventilation without difficulty LMA: LMA inserted LMA Size: 4.0 Number of attempts: 1 Airway Equipment and Method: Bite block Placement Confirmation: positive ETCO2 Dental Injury: Teeth and Oropharynx as per pre-operative assessment

## 2017-11-17 NOTE — H&P (Signed)
[name] is an 57 y.o. female.  G1P1 Long term boyfriend  RP: Removal of IUD under Hysteroscopy  HPI:  IUD strings lost.  Fibroids.  Menopause, well on no HRT.  No PMB.  Unsuccessful attempt at removing the IUD at the office under US guidance.  No pelvic pain.  Normal vaginal secretions.     Pertinent Gynecological History: Menses: Menopausal Contraception: IUD Blood transfusions: none Sexually transmitted diseases: no past history Last mammogram: normal  Last pap: normal  OB History: G1P1   Menstrual History: No LMP recorded. (Menstrual status: IUD).    Past Medical History:  Diagnosis Date  . Anemia    with pregnancy about 30 years ago  . Arthritis    knees  . GERD (gastroesophageal reflux disease)   . Hypertension   . Seasonal allergies   . SUI (stress urinary incontinence, female)     Past Surgical History:  Procedure Laterality Date  . CHOLECYSTECTOMY    . COLONOSCOPY  2018  . KNEE SURGERY    . NASAL SEPTUM SURGERY    . WISDOM TOOTH EXTRACTION      Family History  Problem Relation Age of Onset  . Hypertension Mother   . Hypertension Father   . Breast cancer Paternal Grandmother     Social History:  reports that she has never smoked. She has never used smokeless tobacco. She reports that she drinks alcohol. She reports that she does not use drugs.  Allergies:  Allergies  Allergen Reactions  . Macrodantin [Nitrofurantoin Macrocrystal] Hives  . Tetracyclines & Related Hives  . Bactrim [Sulfamethoxazole-Trimethoprim] Rash    Medications Prior to Admission  Medication Sig Dispense Refill Last Dose  . ibuprofen (ADVIL,MOTRIN) 200 MG tablet Take 400 mg by mouth every 6 (six) hours as needed.   11/16/2017 at Unknown time  . losartan (COZAAR) 50 MG tablet Take 50 mg by mouth daily.   11/17/2017 at 0550  . omeprazole (PRILOSEC) 20 MG capsule Take 20 mg by mouth daily.   11/17/2017 at 0550  . triamterene-hydrochlorothiazide (DYAZIDE) 37.5-25 MG  capsule Take 1 capsule by mouth daily.   11/16/2017 at Unknown time    REVIEW OF SYSTEMS: A ROS was performed and pertinent positives and negatives are included in the history.  GENERAL: No fevers or chills. HEENT: No change in vision, no earache, sore throat or sinus congestion. NECK: No pain or stiffness. CARDIOVASCULAR: No chest pain or pressure. No palpitations. PULMONARY: No shortness of breath, cough or wheeze. GASTROINTESTINAL: No abdominal pain, nausea, vomiting or diarrhea, melena or bright red blood per rectum. GENITOURINARY: No urinary frequency, urgency, hesitancy or dysuria. MUSCULOSKELETAL: No joint or muscle pain, no back pain, no recent trauma. DERMATOLOGIC: No rash, no itching, no lesions. ENDOCRINE: No polyuria, polydipsia, no heat or cold intolerance. No recent change in weight. HEMATOLOGICAL: No anemia or easy bruising or bleeding. NEUROLOGIC: No headache, seizures, numbness, tingling or weakness. PSYCHIATRIC: No depression, no loss of interest in normal activity or change in sleep pattern.    Blood pressure (!) 148/80, pulse 85, temperature 98.6 F (37 C), temperature source Oral, resp. rate 18, height 5\' 6"  (1.676 m), weight 238 lb 3.2 oz (108 kg), SpO2 96 %.  Physical Exam:  See office notes   Results for orders placed or performed during the hospital encounter of 11/17/17 (from the past 24 hour(s))  CBC     Status: None   Collection Time: 11/17/17  7:45 AM  Result Value Ref Range  WBC 6.1 4.0 - 10.5 K/uL   RBC 4.48 3.87 - 5.11 MIL/uL   Hemoglobin 13.6 12.0 - 15.0 g/dL   HCT 41.6 36.0 - 46.0 %   MCV 92.9 78.0 - 100.0 fL   MCH 30.4 26.0 - 34.0 pg   MCHC 32.7 30.0 - 36.0 g/dL   RDW 13.0 11.5 - 15.5 %   Platelets 281 150 - 400 K/uL  I-STAT, chem 8     Status: Abnormal   Collection Time: 11/17/17  7:51 AM  Result Value Ref Range   Sodium 142 135 - 145 mmol/L   Potassium 3.6 3.5 - 5.1 mmol/L   Chloride 100 (L) 101 - 111 mmol/L   BUN 11 6 - 20 mg/dL   Creatinine,  Ser 0.80 0.44 - 1.00 mg/dL   Glucose, Bld 105 (H) 65 - 99 mg/dL   Calcium, Ion 1.18 1.15 - 1.40 mmol/L   TCO2 31 22 - 32 mmol/L   Hemoglobin 13.3 12.0 - 15.0 g/dL   HCT 39.0 36.0 - 46.0 %  Pregnancy, urine POC     Status: None   Collection Time: 11/17/17  8:05 AM  Result Value Ref Range   Preg Test, Ur NEGATIVE NEGATIVE     Assessment/Plan:  1. Encounter for removal of intrauterine contraceptive device (IUD) Attempted removal of IUD under ultrasound guidance.  Procedure impeded by a large submucosal fibroid.  Unsuccessful removal of the IUD.  Decision to proceed with IUD removal under hysteroscopy.  Plan discussed with patient and agreed on.  Surgery including preop, procedure, risks and benefits, as well as postop reviewed thoroughly with patient.  Pamphlet on hysteroscopy given.  Hysteroscopy with removal of IUD today.  2. Intrauterine contraceptive device threads lost, initial encounter As above.   3. Fibroids Intramural and submucosal uterine fibroids.  [name] 11/17/2017, 8:11 AM

## 2017-11-18 ENCOUNTER — Encounter (HOSPITAL_BASED_OUTPATIENT_CLINIC_OR_DEPARTMENT_OTHER): Payer: Self-pay | Admitting: Obstetrics & Gynecology

## 2017-11-22 ENCOUNTER — Telehealth: Payer: Self-pay | Admitting: *Deleted

## 2017-11-22 NOTE — Telephone Encounter (Signed)
Patient had IUD via hysteroscopy on 11/17/17 called stating she is still bleeding changing pads every 4 hours, slight cramping taking ibuprofen help with this. I told pt not abnormal to bleeding after removal, pt said you told her it should not last more than 4 days. Please advise

## 2017-11-24 NOTE — Telephone Encounter (Signed)
It may be due to the SM Fibroid.  Recommend observation until Postop at 3 weeks.

## 2017-11-24 NOTE — Telephone Encounter (Signed)
Pt informed

## 2017-12-09 ENCOUNTER — Encounter: Payer: Self-pay | Admitting: Obstetrics & Gynecology

## 2017-12-09 ENCOUNTER — Ambulatory Visit (INDEPENDENT_AMBULATORY_CARE_PROVIDER_SITE_OTHER): Payer: 59 | Admitting: Obstetrics & Gynecology

## 2017-12-09 VITALS — BP 130/86

## 2017-12-09 DIAGNOSIS — R5383 Other fatigue: Secondary | ICD-10-CM

## 2017-12-09 DIAGNOSIS — N951 Menopausal and female climacteric states: Secondary | ICD-10-CM | POA: Diagnosis not present

## 2017-12-09 DIAGNOSIS — Z09 Encounter for follow-up examination after completed treatment for conditions other than malignant neoplasm: Secondary | ICD-10-CM

## 2017-12-09 MED ORDER — NORETHINDRONE 0.35 MG PO TABS: 1.0000 | ORAL_TABLET | Freq: Every day | ORAL | 4 refills | Status: DC

## 2017-12-09 NOTE — Progress Notes (Signed)
    Karen Webster 04/03/1961 [id number]        57 y.o.  G1P1 Long term boyfriend  RP: Post Mirena IUD removal by [facility]  HPI: Had Mirena IUD removal by hysteroscopy on November 17, 2017.  Removal was easy by hysteroscopy and no complication occurred.  Patient has known uterine fibroids.  She was assumed to be in menopause but not confirmed and since the removal of the Mirena IUD, has experienced frequent vaginal spotting which has stopped since yesterday.  No current pelvic pain or abnormal vaginal discharge.  Feels fatigued and has a low mood, but no frank depression symptoms or suicidal ideations.  Occasional hot flashes.  Urine and bowel movements normal.  No fever.   OB History  Gravida Para Term Preterm AB Living  1 1       1   SAB TAB Ectopic Multiple Live Births               # Outcome Date GA Lbr Len/2nd Weight Sex Delivery Anes PTL Lv  1 Para             Past medical history,surgical history, problem list, medications, allergies, family history and social history were all reviewed and documented in the EPIC chart.   Directed ROS with pertinent positives and negatives documented in the history of present illness/assessment and plan.  Exam:  Vitals:   12/09/17 1113  BP: 130/86   General appearance:  Normal  Abdomen: Soft, normal.  Gynecologic exam: Vulva normal.  Bimanual exam: Uterus nodular mildly increased in volume, mobile, nontender.  Cervix nontender to mobilization.  No adnexal mass felt, nontender.  Normal vaginal secretions, no current bleeding.   Assessment/Plan:  57 y.o. G1P1   1. Status post gynecological surgery, follow-up exam Good postop evolution with no complication.  Postop spotting lingered, but now resolved.  2. Fatigue, unspecified type Patient feels tired with a low mood and some hot flashes.  Will rule out thyroid dysfunction and anemia.  Will confirm menopause with an [facility] today. - CBC - TSH - FSH  3. Perimenopausal Confirmed menopause with  [place] drawn today.  Will start on the progestin only pill for contraception and possibly hormonal control if in perimenopause.  Usage, risks and benefits reviewed.  If menopause is confirmed, patient will follow-up as needed to discuss hormone replacement therapy. - FSH  Other orders - norethindrone (MICRONOR,CAMILA,ERRIN) 0.35 MG tablet; Take 1 tablet (0.35 mg total) by mouth daily.  Counseling on above issues and coordination of care more than 50% for 15 minutes.  Princess Bruins MD, 11:22 AM 12/09/2017

## 2017-12-10 ENCOUNTER — Encounter: Payer: Self-pay | Admitting: Obstetrics & Gynecology

## 2017-12-10 DIAGNOSIS — N390 Urinary tract infection, site not specified: Secondary | ICD-10-CM | POA: Diagnosis not present

## 2017-12-10 DIAGNOSIS — R350 Frequency of micturition: Secondary | ICD-10-CM | POA: Diagnosis not present

## 2017-12-10 DIAGNOSIS — B961 Klebsiella pneumoniae [K. pneumoniae] as the cause of diseases classified elsewhere: Secondary | ICD-10-CM | POA: Diagnosis not present

## 2017-12-10 LAB — CBC
HCT: 38.7 % (ref 35.0–45.0)
Hemoglobin: 13.3 g/dL (ref 11.7–15.5)
MCH: 30.6 pg (ref 27.0–33.0)
MCHC: 34.4 g/dL (ref 32.0–36.0)
MCV: 89 fL (ref 80.0–100.0)
MPV: 10.9 fL (ref 7.5–12.5)
PLATELETS: 296 10*3/uL (ref 140–400)
RBC: 4.35 10*6/uL (ref 3.80–5.10)
RDW: 12.1 % (ref 11.0–15.0)
WBC: 7.3 10*3/uL (ref 3.8–10.8)

## 2017-12-10 LAB — FOLLICLE STIMULATING HORMONE: FSH: 52.9 m[IU]/mL

## 2017-12-10 LAB — TSH: TSH: 1.82 m[IU]/L (ref 0.40–4.50)

## 2017-12-10 NOTE — Patient Instructions (Signed)
1. Status post gynecological surgery, follow-up exam Good postop evolution with no complication.  Postop spotting lingered, but now resolved.  2. Fatigue, unspecified type Patient feels tired with a low mood and some hot flushes.  Will rule out thyroid dysfunction and anemia.  Will confirm menopause with an [facility] today. - CBC - TSH - FSH  3. Perimenopausal Confirm menopause with [place] drawn today.  Will start on the progestin only pill for contraception and possibly hormonal control if in perimenopause.  Usage, risks and benefits reviewed.  If menopause is confirmed, patient will follow-up as needed to discuss hormone replacement therapy. - FSH  Other orders - norethindrone (MICRONOR,CAMILA,ERRIN) 0.35 MG tablet; Take 1 tablet (0.35 mg total) by mouth daily.  Neoma Laming, good seeing you today!

## 2017-12-20 ENCOUNTER — Telehealth: Payer: Self-pay | Admitting: *Deleted

## 2017-12-20 NOTE — Telephone Encounter (Signed)
patient called with several questions/concerns:  1. Still bleeding since IUD removal on 11/17/17, started the Camila pills on 12/09/17, changing pads twice a day, would like to stop bleeding not able to have intercourse due to bleeding.  2. [place] level elevated at 52.9, asked if she still should use condoms with intercourse when she does stop bleeding.   Please advise

## 2017-12-21 NOTE — Telephone Encounter (Signed)
Pt informed with the below note, transferred to front desk.

## 2017-12-21 NOTE — Telephone Encounter (Signed)
Please schedule patient for a visit with me for an Endometrial biopsy.  If last Annual/Gyn visit is more than a year ago, we can combine the appointments.  We will discuss management of bleeding as well and consider alternatives to the Progestin-only pill.  Recommend condoms with first pack of Progestin-only pill.  If patient can, schedule this week please.

## 2017-12-22 DIAGNOSIS — M6281 Muscle weakness (generalized): Secondary | ICD-10-CM | POA: Diagnosis not present

## 2017-12-22 DIAGNOSIS — M6289 Other specified disorders of muscle: Secondary | ICD-10-CM | POA: Diagnosis not present

## 2017-12-23 ENCOUNTER — Ambulatory Visit: Payer: 59 | Admitting: Obstetrics & Gynecology

## 2017-12-23 ENCOUNTER — Encounter: Payer: Self-pay | Admitting: Obstetrics & Gynecology

## 2017-12-23 VITALS — BP 130/82 | Temp 97.6°F

## 2017-12-23 DIAGNOSIS — N951 Menopausal and female climacteric states: Secondary | ICD-10-CM

## 2017-12-23 DIAGNOSIS — N950 Postmenopausal bleeding: Secondary | ICD-10-CM | POA: Diagnosis not present

## 2017-12-23 MED ORDER — PROGESTERONE MICRONIZED 100 MG PO CAPS: 100.0000 mg | ORAL_CAPSULE | Freq: Every day | ORAL | 0 refills | Status: DC

## 2017-12-23 NOTE — Progress Notes (Signed)
    Karen Webster 1960-10-01 [id number]        57 y.o.  G1P1   RP: Postmenopausal/perimenopausal bleeding  HPI:  [place] 52.9 on 12/09/2017.  Mirena IUD removed under [facility] 11/17/2017.  Uterine Fibroids.  Vaginal bleeding/spotting on-off x removal of Mirena IUD.  More hot flushes, insomnia, general feeling of fatigue and low mood since the Mirena IUD removal.  Patient is on the Progestin-only pill for contraception since the IUD removal.   OB History  Gravida Para Term Preterm AB Living  1 1       1   SAB TAB Ectopic Multiple Live Births               # Outcome Date GA Lbr Len/2nd Weight Sex Delivery Anes PTL Lv  1 Para             Past medical history,surgical history, problem list, medications, allergies, family history and social history were all reviewed and documented in the EPIC chart.   Directed ROS with pertinent positives and negatives documented in the history of present illness/assessment and plan.  Exam:  Vitals:   12/23/17 1036  BP: 130/82  Temp: 97.6 F (36.4 C)  TempSrc: Oral   General appearance:  Normal  Endometrial Biopsy:  Verbal consent obtained.  Procedure:  Vulva normal.  Speculum:  Cervix/Vagina normal.  Normal secretions, no current bleeding.  Betadine prep on cervix.  Hurricane spray.  Tenaculum on anterior lip of cervix.  Easy passage of the EBx canula through the endocervix.  Biopsy with suction on all endometrial surfaces of the IU cavity.  All instruments removed.  Specimen sent to pathology.  Well tolerated.   Assessment/Plan:  57 y.o. G1P1   1. Postmenopausal bleeding R/O Endometrial pathology.  Successful Endometrial Bx.  Pending results. - Surgical pathology  2. Menopause syndrome Low mood and hot flushes/night sweats with difficulty sleeping.  Given the PMB and pending EBx, will not start on Estradiol.  Decision to start on Progesterone.  Usage, risks and benefits reviewed.  Prometrium 100 mg PO HS prescribed.  Will also continue with the  Progestin-only pill for contraception at this time.  Other orders - progesterone (PROMETRIUM) 100 MG capsule; Take 1 capsule (100 mg total) by mouth at bedtime.  Counseling on above issues and coordination of care >50% x 15 minutes  Karen Bruins MD, 10:53 AM 12/23/2017

## 2017-12-23 NOTE — Progress Notes (Signed)
surg

## 2017-12-25 ENCOUNTER — Encounter: Payer: Self-pay | Admitting: Obstetrics & Gynecology

## 2017-12-25 NOTE — Patient Instructions (Signed)
1. Postmenopausal bleeding R/O Endometrial pathology.  Successful Endometrial Bx.  Pending results. - Surgical pathology  2. Menopause syndrome Low mood and hot flushes/night sweats with difficulty sleeping.  Given the PMB and pending EBx, will not start on Estradiol.  Decision to start on Progesterone.  Usage, risks and benefits reviewed.  Prometrium 100 mg PO HS prescribed.  Will also continue with the Progestin-only pill for contraception at this time.  Other orders - progesterone (PROMETRIUM) 100 MG capsule; Take 1 capsule (100 mg total) by mouth at bedtime.  Karen Webster, good seeing you today!  I will inform you of your results as soon as they are available.

## 2018-01-03 DIAGNOSIS — M6281 Muscle weakness (generalized): Secondary | ICD-10-CM | POA: Diagnosis not present

## 2018-01-03 DIAGNOSIS — M62838 Other muscle spasm: Secondary | ICD-10-CM | POA: Diagnosis not present

## 2018-01-03 DIAGNOSIS — M6289 Other specified disorders of muscle: Secondary | ICD-10-CM | POA: Diagnosis not present

## 2018-01-13 DIAGNOSIS — M62838 Other muscle spasm: Secondary | ICD-10-CM | POA: Diagnosis not present

## 2018-01-13 DIAGNOSIS — M6289 Other specified disorders of muscle: Secondary | ICD-10-CM | POA: Diagnosis not present

## 2018-03-21 ENCOUNTER — Other Ambulatory Visit: Payer: Self-pay | Admitting: Obstetrics & Gynecology

## 2018-03-21 MED ORDER — PROGESTERONE MICRONIZED 100 MG PO CAPS: 100.0000 mg | ORAL_CAPSULE | Freq: Every day | ORAL | 3 refills | Status: DC

## 2018-03-21 NOTE — Telephone Encounter (Signed)
Patient had annual exam on 09/07/17 at [facility] chart scanned in epic. Rx sent for progesterone 100 mg

## 2018-06-13 DIAGNOSIS — H26492 Other secondary cataract, left eye: Secondary | ICD-10-CM | POA: Diagnosis not present

## 2018-06-13 DIAGNOSIS — H26493 Other secondary cataract, bilateral: Secondary | ICD-10-CM | POA: Diagnosis not present

## 2018-06-13 DIAGNOSIS — H47323 Drusen of optic disc, bilateral: Secondary | ICD-10-CM | POA: Diagnosis not present

## 2018-06-13 DIAGNOSIS — Z961 Presence of intraocular lens: Secondary | ICD-10-CM | POA: Diagnosis not present

## 2018-07-06 DIAGNOSIS — H0015 Chalazion left lower eyelid: Secondary | ICD-10-CM | POA: Diagnosis not present

## 2018-08-25 DIAGNOSIS — M5412 Radiculopathy, cervical region: Secondary | ICD-10-CM | POA: Insufficient documentation

## 2018-08-25 DIAGNOSIS — M79641 Pain in right hand: Secondary | ICD-10-CM | POA: Diagnosis not present

## 2018-08-25 DIAGNOSIS — M5032 Other cervical disc degeneration, mid-cervical region, unspecified level: Secondary | ICD-10-CM | POA: Diagnosis not present

## 2018-08-25 DIAGNOSIS — M542 Cervicalgia: Secondary | ICD-10-CM | POA: Diagnosis not present

## 2018-09-05 DIAGNOSIS — M542 Cervicalgia: Secondary | ICD-10-CM | POA: Diagnosis not present

## 2018-09-12 DIAGNOSIS — M542 Cervicalgia: Secondary | ICD-10-CM | POA: Diagnosis not present

## 2018-09-13 ENCOUNTER — Ambulatory Visit: Payer: 59 | Admitting: Family Medicine

## 2018-09-14 ENCOUNTER — Ambulatory Visit (INDEPENDENT_AMBULATORY_CARE_PROVIDER_SITE_OTHER): Payer: 59 | Admitting: Family Medicine

## 2018-09-14 ENCOUNTER — Encounter: Payer: Self-pay | Admitting: Family Medicine

## 2018-09-14 VITALS — BP 118/74 | HR 68 | Temp 98.8°F | Ht 66.0 in | Wt 238.4 lb

## 2018-09-14 DIAGNOSIS — Z5181 Encounter for therapeutic drug level monitoring: Secondary | ICD-10-CM

## 2018-09-14 DIAGNOSIS — Z0001 Encounter for general adult medical examination with abnormal findings: Secondary | ICD-10-CM

## 2018-09-14 DIAGNOSIS — M542 Cervicalgia: Secondary | ICD-10-CM | POA: Diagnosis not present

## 2018-09-14 DIAGNOSIS — M5412 Radiculopathy, cervical region: Secondary | ICD-10-CM

## 2018-09-14 DIAGNOSIS — R739 Hyperglycemia, unspecified: Secondary | ICD-10-CM | POA: Diagnosis not present

## 2018-09-14 DIAGNOSIS — N951 Menopausal and female climacteric states: Secondary | ICD-10-CM | POA: Diagnosis not present

## 2018-09-14 DIAGNOSIS — J329 Chronic sinusitis, unspecified: Secondary | ICD-10-CM

## 2018-09-14 DIAGNOSIS — I10 Essential (primary) hypertension: Secondary | ICD-10-CM

## 2018-09-14 DIAGNOSIS — K219 Gastro-esophageal reflux disease without esophagitis: Secondary | ICD-10-CM

## 2018-09-14 DIAGNOSIS — Z13220 Encounter for screening for lipoid disorders: Secondary | ICD-10-CM | POA: Diagnosis not present

## 2018-09-14 LAB — COMPREHENSIVE METABOLIC PANEL
ALT: 45 U/L — ABNORMAL HIGH (ref 0–35)
AST: 31 U/L (ref 0–37)
Albumin: 4.3 g/dL (ref 3.5–5.2)
Alkaline Phosphatase: 67 U/L (ref 39–117)
BUN: 16 mg/dL (ref 6–23)
CALCIUM: 9.5 mg/dL (ref 8.4–10.5)
CO2: 32 mEq/L (ref 19–32)
Chloride: 101 mEq/L (ref 96–112)
Creatinine, Ser: 0.81 mg/dL (ref 0.40–1.20)
GFR: 72.16 mL/min (ref >60.00)
Glucose, Bld: 89 mg/dL (ref 70–99)
Potassium: 3.8 mEq/L (ref 3.5–5.1)
Sodium: 141 mEq/L (ref 135–145)
Total Bilirubin: 0.4 mg/dL (ref 0.2–1.2)
Total Protein: 6.6 g/dL (ref 6.0–8.3)

## 2018-09-14 LAB — CBC
HCT: 40.1 % (ref 36.0–46.0)
Hemoglobin: 13.7 g/dL (ref 12.0–15.0)
MCHC: 34.1 g/dL (ref 30.0–36.0)
MCV: 90.8 fl (ref 78.0–100.0)
Platelets: 272 10*3/uL (ref 150.0–400.0)
RBC: 4.42 Mil/uL (ref 3.87–5.11)
RDW: 13.1 % (ref 11.5–15.5)
WBC: 5.5 10*3/uL (ref 4.0–10.5)

## 2018-09-14 LAB — LIPID PANEL
Cholesterol: 193 mg/dL (ref 0–200)
HDL: 33.6 mg/dL — ABNORMAL LOW (ref >39.00)
LDL Cholesterol: 132 mg/dL — ABNORMAL HIGH (ref 0–99)
NonHDL: 159.46
Total CHOL/HDL Ratio: 6
Triglycerides: 139 mg/dL (ref 0.0–149.0)
VLDL: 27.8 mg/dL (ref 0.0–40.0)

## 2018-09-14 LAB — VITAMIN B12: VITAMIN B 12: 220 pg/mL (ref 211–911)

## 2018-09-14 LAB — TSH: TSH: 1.71 u[IU]/mL (ref 0.35–4.50)

## 2018-09-14 LAB — HEMOGLOBIN A1C: Hgb A1c MFr Bld: 5.6 % (ref 4.6–6.5)

## 2018-09-14 MED ORDER — TRIAMTERENE-HCTZ 37.5-25 MG PO CAPS: 1.0000 | ORAL_CAPSULE | Freq: Every day | ORAL | 3 refills | Status: DC

## 2018-09-14 MED ORDER — OMEPRAZOLE 20 MG PO CPDR: 20.0000 mg | DELAYED_RELEASE_CAPSULE | Freq: Every day | ORAL | 3 refills | Status: DC

## 2018-09-14 NOTE — Assessment & Plan Note (Signed)
Stable.  Will refill Prilosec.

## 2018-09-14 NOTE — Assessment & Plan Note (Signed)
Continue management per gynecology.

## 2018-09-14 NOTE — Assessment & Plan Note (Signed)
Stable.  Continue management per orthopedics. 

## 2018-09-14 NOTE — Patient Instructions (Signed)
It was very nice to see you today!  It is okay for you to stop taking the losartan.  Please keep an eye on your blood pressure in the next few weeks and let me know if persistently 140/90 or higher.  Please continue working on a healthy, low-salt diet.  Please try to get as much exercise as you can.  We will check blood work today.    Please let me know if your stomach pain does not improve or if your sinus congestion does not improve.  Please come back to see me in 1 year for your next physical, or sooner as needed.  Take care, Dr [name]   Preventive Care 40-64 Years, Female Preventive care refers to lifestyle choices and visits with your health care provider that can promote health and wellness. What does preventive care include?   A yearly physical exam. This is also called an annual well check.  Dental exams once or twice a year.  Routine eye exams. Ask your health care provider how often you should have your eyes checked.  Personal lifestyle choices, including: ? Daily care of your teeth and gums. ? Regular physical activity. ? Eating a healthy diet. ? Avoiding tobacco and drug use. ? Limiting alcohol use. ? Practicing safe sex. ? Taking low-dose aspirin daily starting at age 70. ? Taking vitamin and mineral supplements as recommended by your health care provider. What happens during an annual well check? The services and screenings done by your health care provider during your annual well check will depend on your age, overall health, lifestyle risk factors, and family history of disease. Counseling Your health care provider may ask you questions about your:  Alcohol use.  Tobacco use.  Drug use.  Emotional well-being.  Home and relationship well-being.  Sexual activity.  Eating habits.  Work and work Statistician.  Method of birth control.  Menstrual cycle.  Pregnancy history. Screening You may have the following tests or measurements:  Height,  weight, and BMI.  Blood pressure.  Lipid and cholesterol levels. These may be checked every 5 years, or more frequently if you are over 5 years old.  Skin check.  Lung cancer screening. You may have this screening every year starting at age 34 if you have a 30-pack-year history of smoking and currently smoke or have quit within the past 15 years.  Colorectal cancer screening. All adults should have this screening starting at age 42 and continuing until age 67. Your health care provider may recommend screening at age 28. You will have tests every 1-10 years, depending on your results and the type of screening test. People at increased risk should start screening at an earlier age. Screening tests may include: ? Guaiac-based fecal occult blood testing. ? Fecal immunochemical test (FIT). ? Stool DNA test. ? Virtual colonoscopy. ? Sigmoidoscopy. During this test, a flexible tube with a tiny camera (sigmoidoscope) is used to examine your rectum and lower colon. The sigmoidoscope is inserted through your anus into your rectum and lower colon. ? Colonoscopy. During this test, a long, thin, flexible tube with a tiny camera (colonoscope) is used to examine your entire colon and rectum.  Hepatitis C blood test.  Hepatitis B blood test.  Sexually transmitted disease (STD) testing.  Diabetes screening. This is done by checking your blood sugar (glucose) after you have not eaten for a while (fasting). You may have this done every 1-3 years.  Mammogram. This may be done every 1-2 years. Talk to  your health care provider about when you should start having regular mammograms. This may depend on whether you have a family history of breast cancer.  BRCA-related cancer screening. This may be done if you have a family history of breast, ovarian, tubal, or peritoneal cancers.  Pelvic exam and Pap test. This may be done every 3 years starting at age 45. Starting at age 38, this may be done every 5 years if  you have a Pap test in combination with an HPV test.  Bone density scan. This is done to screen for osteoporosis. You may have this scan if you are at high risk for osteoporosis. Discuss your test results, treatment options, and if necessary, the need for more tests with your health care provider. Vaccines Your health care provider may recommend certain vaccines, such as:  Influenza vaccine. This is recommended every year.  Tetanus, diphtheria, and acellular pertussis (Tdap, Td) vaccine. You may need a Td booster every 10 years.  Varicella vaccine. You may need this if you have not been vaccinated.  Zoster vaccine. You may need this after age 50.  Measles, mumps, and rubella (MMR) vaccine. You may need at least one dose of MMR if you were born in 1957 or later. You may also need a second dose.  Pneumococcal 13-valent conjugate (PCV13) vaccine. You may need this if you have certain conditions and were not previously vaccinated.  Pneumococcal polysaccharide (PPSV23) vaccine. You may need one or two doses if you smoke cigarettes or if you have certain conditions.  Meningococcal vaccine. You may need this if you have certain conditions.  Hepatitis A vaccine. You may need this if you have certain conditions or if you travel or work in places where you may be exposed to hepatitis A.  Hepatitis B vaccine. You may need this if you have certain conditions or if you travel or work in places where you may be exposed to hepatitis B.  Haemophilus influenzae type b (Hib) vaccine. You may need this if you have certain conditions. Talk to your health care provider about which screenings and vaccines you need and how often you need them. This information is not intended to replace advice given to you by your health care provider. Make sure you discuss any questions you have with your health care provider. Document Released: 08/16/2015 Document Revised: 09/09/2017 Document Reviewed: 05/21/2015 Elsevier  Interactive Patient Education  2019 [employer].

## 2018-09-14 NOTE — Assessment & Plan Note (Signed)
At goal.  Continue triamterene-HCTZ 37.5-25 once daily.  Check C met, CBC, and TSH.  Discussed home blood pressure monitoring with goal 140/90 or lower.  Discussed lifestyle modifications including healthy, low-salt diet and regular exercise. 

## 2018-09-14 NOTE — Progress Notes (Signed)
Subjective:  Karen Webster is a 57 y.o. female who presents today for her annual comprehensive physical exam and to establish care.  HPI:  She has no acute complaints today.    She has had some upper abdominal pain for the past few days but has been off her omeprazole for the past several days.  No vomiting.  No early satiety.  She is also had some right ear pain/pressure and sinus congestion for the past few days.  She has been using a sinus rinse for this which is helped.  Her chronic medical conditions are outlined below:  # Essential Hypertension  - On dyazide 37.5-25 1 capsule daily and tolerating well - ROS: No reported chest pain or shortness of breath.   # GERD - Takes omeprazole '20mg'$  daily  # Cervical Radiculopathy secondary to degenerative disc disease - Follows with orthopedics - On naproxen '500mg'$  twice daily and doing well.   % Menopausal Symptoms - Follows with Gynecology - On progesterone '100mg'$  daily and norethindrone 0.'35mg'$  once daily  Lifestyle Diet: No specific diets or eating plans.  Exercise: No regular exercise.   Depression screen PHQ 2/9 09/14/2018  Decreased Interest 0  Down, Depressed, Hopeless 0  PHQ - 2 Score 0   Health Maintenance Due  Topic Date Due  . Hepatitis C Screening  07/29/1961  . HIV Screening  11/10/1973  . COLONOSCOPY  11/10/2008    ROS: Per HPI, otherwise a complete review of systems was negative.   PMH:  The following were reviewed and entered/updated in epic: Past Medical History:  Diagnosis Date  . Arthritis    knees  . GERD (gastroesophageal reflux disease)   . Hypertension   . Seasonal allergies   . SUI (stress urinary incontinence, female)    Patient Active Problem List   Diagnosis Date Noted  . Essential hypertension 09/14/2018  . Gastroesophageal reflux disease without esophagitis 09/14/2018  . Menopausal symptoms 09/14/2018  . Cervical radiculopathy secondary to DDD 08/25/2018   Past Surgical  History:  Procedure Laterality Date  . CATARACT EXTRACTION, BILATERAL    . CHOLECYSTECTOMY    . COLONOSCOPY  2018  . HYSTEROSCOPY N/A 11/17/2017   Procedure: HYSTEROSCOPY FOR REMOVAL OF IUD;  Surgeon: Princess Bruins, MD;  Location: [facility];  Service: Gynecology;  Laterality: N/A;  . KNEE SURGERY     Right knee, Dr Reynaldo Minium  . NASAL SEPTUM SURGERY    . WISDOM TOOTH EXTRACTION      Family History  Problem Relation Age of Onset  . Hypertension Mother   . Hypertension Father   . Breast cancer Paternal Grandmother   . Cancer Paternal Grandmother   . Dementia Maternal Grandmother   . Heart disease Maternal Grandfather   . Heart disease Paternal Grandfather   . Colon cancer Neg Hx     Medications- reviewed and updated Current Outpatient Medications  Medication Sig Dispense Refill  . naproxen (NAPROSYN) 500 MG tablet naproxen 500 mg tablet  TAKE 1 TABLET BY MOUTH TWICE A DAY    . norethindrone (MICRONOR,CAMILA,ERRIN) 0.35 MG tablet Take 1 tablet (0.35 mg total) by mouth daily. 3 Package 4  . omeprazole (PRILOSEC) 20 MG capsule Take 1 capsule (20 mg total) by mouth daily. 90 capsule 3  . progesterone (PROMETRIUM) 100 MG capsule Take 1 capsule (100 mg total) by mouth at bedtime. 90 capsule 3  . triamterene-hydrochlorothiazide (DYAZIDE) 37.5-25 MG capsule Take 1 each (1 capsule total) by mouth daily. 90 capsule 3  No current facility-administered medications for this visit.     Allergies-reviewed and updated Allergies  Allergen Reactions  . Nitrofurantoin Hives and Rash  . Macrodantin [Nitrofurantoin Macrocrystal] Hives  . Tetracycline Hives  . Tetracyclines & Related Hives  . Bactrim [Sulfamethoxazole-Trimethoprim] Rash    Social History   Socioeconomic History  . Marital status: Widowed    Spouse name: Not on file  . Number of children: Not on file  . Years of education: Not on file  . Highest education level: Not on file  Occupational History  .  Not on file  Social Needs  . Financial resource strain: Not on file  . Food insecurity:    Worry: Not on file    Inability: Not on file  . Transportation needs:    Medical: Not on file    Non-medical: Not on file  Tobacco Use  . Smoking status: Never Smoker  . Smokeless tobacco: Never Used  Substance and Sexual Activity  . Alcohol use: Yes    Comment: SOCIAL   . Drug use: Never  . Sexual activity: Yes    Partners: Male    Birth control/protection: None, Pill    Comment: 1st intercourse- 11, partners-6, current partner- 79 ys   Lifestyle  . Physical activity:    Days per week: Not on file    Minutes per session: Not on file  . Stress: Not on file  Relationships  . Social connections:    Talks on phone: Not on file    Gets together: Not on file    Attends religious service: Not on file    Active member of club or organization: Not on file    Attends meetings of clubs or organizations: Not on file    Relationship status: Not on file  Other Topics Concern  . Not on file  Social History Narrative  . Not on file    Objective:  Physical Exam: BP 118/74 (BP Location: Left Arm, Patient Position: Sitting, Cuff Size: Large)   Pulse 68   Temp 98.8 F (37.1 C) (Oral)   Ht _0  (1.676 m)   Wt 238 lb 6.1 oz (108.1 kg)   SpO2 96%   BMI 38.48 kg/m   Body mass index is 38.48 kg/m. Wt Readings from Last 3 Encounters:  09/14/18 238 lb 6.1 oz (108.1 kg)  11/17/17 238 lb 3.2 oz (108 kg)  10/26/17 242 lb (109.8 kg)   Gen: NAD, resting comfortably HEENT: TMs normal bilaterally. OP clear. No thyromegaly noted.  CV: RRR with no murmurs appreciated Pulm: NWOB, CTAB with no crackles, wheezes, or rhonchi GI: Normal bowel sounds present. Soft, Nontender, Nondistended. MSK: no edema, cyanosis, or clubbing noted Skin: warm, dry Neuro: CN2-12 grossly intact. Strength 5/5 in upper and lower extremities. Reflexes symmetric and intact bilaterally.  Psych: Normal affect and thought  content  Assessment/Plan:  Menopausal symptoms Continue management per gynecology.  Gastroesophageal reflux disease without esophagitis Stable.  Will refill Prilosec.  Essential hypertension At goal.  Continue triamterene-HCTZ 37.5-25 once daily.  Check C met, CBC, and TSH.  Discussed home blood pressure monitoring with goal 140/90 or lower.  Discussed lifestyle modifications including healthy, low-salt diet and regular exercise.  Cervical radiculopathy secondary to DDD Stable.  Continue management per orthopedics.  Hyperglycemia Check A1c.  Abdominal Pain No red flags.  Likely secondary to being off PPI.  Her NSAID use could also be contributing.  Will restart PPI as noted above.  Discussed reasons to return to  care.  Sinus congestion Likely allergic rhinitis versus mild viral URI.  Continue sinus rinse.  Recommended Flonase as needed  Preventative Healthcare: Check lipid panel.  Up-to-date on mammogram and Pap smear- gets these done via gynecology.  Will obtain records from most recent colonoscopy.  Patient Counseling(The following topics were reviewed and/or handout was given):  -Nutrition: Stressed importance of moderation in sodium/caffeine intake, saturated fat and cholesterol, caloric balance, sufficient intake of fresh fruits, vegetables, and fiber.  -Stressed the importance of regular exercise.   -Substance Abuse: Discussed cessation/primary prevention of tobacco, alcohol, or other drug use; driving or other dangerous activities under the influence; availability of treatment for abuse.   -Injury prevention: Discussed safety belts, safety helmets, smoke detector, smoking near bedding or upholstery.   -Sexuality: Discussed sexually transmitted diseases, partner selection, use of condoms, avoidance of unintended pregnancy and contraceptive alternatives.   -Dental health: Discussed importance of regular tooth brushing, flossing, and dental visits.  -Health maintenance and  immunizations reviewed. Please refer to Health maintenance section.  Return to care in 1 year for next preventative visit.   [name]. [name], MD 09/14/2018 10:44 AM

## 2018-09-15 ENCOUNTER — Ambulatory Visit: Payer: 59 | Admitting: Family Medicine

## 2018-09-16 ENCOUNTER — Encounter: Payer: Self-pay | Admitting: Family Medicine

## 2018-09-16 DIAGNOSIS — E785 Hyperlipidemia, unspecified: Secondary | ICD-10-CM | POA: Insufficient documentation

## 2018-09-16 NOTE — Progress Notes (Signed)
Please inform patient of the following:  Her "good" cholesterol is a bit low and her "bad" cholesterol is a bit high. Do not need to start meds. She should continue to work on diet and exercise and we can recheck in a year.  All of her other labs are NORMAL.   Karen Webster. [name], MD 09/16/2018 12:55 PM

## 2018-09-21 DIAGNOSIS — M542 Cervicalgia: Secondary | ICD-10-CM | POA: Diagnosis not present

## 2018-09-27 DIAGNOSIS — M542 Cervicalgia: Secondary | ICD-10-CM | POA: Diagnosis not present

## 2018-09-29 DIAGNOSIS — M542 Cervicalgia: Secondary | ICD-10-CM | POA: Diagnosis not present

## 2018-10-18 ENCOUNTER — Encounter: Payer: Self-pay | Admitting: Physical Therapy

## 2018-10-25 ENCOUNTER — Encounter: Payer: Self-pay | Admitting: Family Medicine

## 2018-10-27 ENCOUNTER — Encounter: Payer: Self-pay | Admitting: Family Medicine

## 2018-10-28 ENCOUNTER — Ambulatory Visit: Payer: 59 | Admitting: Family Medicine

## 2018-10-31 ENCOUNTER — Ambulatory Visit (INDEPENDENT_AMBULATORY_CARE_PROVIDER_SITE_OTHER): Payer: 59 | Admitting: Family Medicine

## 2018-10-31 ENCOUNTER — Encounter: Payer: Self-pay | Admitting: Family Medicine

## 2018-10-31 VITALS — BP 148/90 | HR 82 | Temp 97.9°F | Ht 66.0 in | Wt 230.0 lb

## 2018-10-31 DIAGNOSIS — R05 Cough: Secondary | ICD-10-CM | POA: Diagnosis not present

## 2018-10-31 DIAGNOSIS — I10 Essential (primary) hypertension: Secondary | ICD-10-CM

## 2018-10-31 DIAGNOSIS — R059 Cough, unspecified: Secondary | ICD-10-CM

## 2018-10-31 MED ORDER — AMLODIPINE BESYLATE 10 MG PO TABS: 10.0000 mg | ORAL_TABLET | Freq: Every day | ORAL | 3 refills | Status: DC

## 2018-10-31 NOTE — Patient Instructions (Signed)
Health Maintenance Due  Topic Date Due  . Hepatitis C Screening  Sep 16, 1960  . HIV Screening  11/10/1973  . COLONOSCOPY  11/10/2008    Depression screen PHQ 2/9 09/14/2018  Decreased Interest 0  Down, Depressed, Hopeless 0  PHQ - 2 Score 0

## 2018-10-31 NOTE — Assessment & Plan Note (Signed)
Uncontrolled.  Discussed treatment options including restarting losartan versus addition of amlodipine.  Due to favorable side effect profile, we will start amlodipine 10 mg daily.  She will continue Dyazide 37.5-25 mg capsule once daily.  She will follow-up with me in 1 to 2 weeks and she will continue monitoring home blood pressures with goal 140/90 or lower.

## 2018-10-31 NOTE — Progress Notes (Signed)
    Chief Complaint:  Karen Webster is a 58 y.o. female who presents today for a virtual office visit with a chief complaint of elevated blood pressure reading.   Assessment/Plan:  Essential hypertension Uncontrolled.  Discussed treatment options including restarting losartan versus addition of amlodipine.  Due to favorable side effect profile, we will start amlodipine 10 mg daily.  She will continue Dyazide 37.5-25 mg capsule once daily.  She will follow-up with me in 1 to 2 weeks and she will continue monitoring home blood pressures with goal 140/90 or lower.  Cough Likely seasonal allergies.  Recommended over-the-counter allergy medication such as Claritin or Zyrtec.  Recommended avoidance of decongestant such as Sudafed.  Discussed reasons to return to care.  Follow-up as needed.    Subjective:  HPI:  # Essential Hypertension  -Chronic problem. - On dyazide 37.5-25 1 capsule daily and tolerating well - Home blood pressures have been into the 140s over 90s. - ROS: No reported chest pain or shortness of breath.   She also reports increasing cough and rhinorrhea for the past week or 2.  She said this is due to seasonal allergies.  She has been afraid to try any over-the-counter medication due to possibly increasing her blood pressure.  She has had some associated fatigue and malaise as well.  This is a routine, regularly.  As noted above, she does not have any chest pain or shortness of breath.  No known sick contacts.  No reported fevers. No other obvious alleviating or aggravating factors.   ROS: Per HPI  PMH: She reports that she has never smoked. She has never used smokeless tobacco. She reports current alcohol use. She reports that she does not use drugs.      Objective/Observations  Physical Exam: BP (!) 148/90 (BP Location: Left Arm, Patient Position: Sitting, Cuff Size: Normal)   Pulse 82   Temp 97.9 F (36.6 C) (Oral)   Ht 5\' 6"  (1.676 m)   Wt 230 lb (104.3 kg)   LMP  09/01/2018 Comment: Some spotting.  BMI 37.12 kg/m  Gen: NAD, resting comfortably Pulm: Normal work of breathing Neuro: Grossly normal, moves all extremities Psych: Normal affect and thought content  Virtual Visit via Video   I connected with Karen Webster on 10/31/18 at 11:20 AM EDT by a video enabled telemedicine application and verified that I am speaking with the correct person using two identifiers. I discussed the limitations of evaluation and management by telemedicine and the availability of in person appointments. The patient expressed understanding and agreed to proceed. Due to limited bandwith and choppy video/audio, the visit was converted to telephone after several minutes.  Patient location: Home Provider location: [place] participating in the virtual visit: Myself and patient     [name]. [name], MD 10/31/2018 12:05 PM

## 2019-01-02 ENCOUNTER — Other Ambulatory Visit: Payer: Self-pay | Admitting: Obstetrics & Gynecology

## 2019-01-09 ENCOUNTER — Encounter: Payer: Self-pay | Admitting: Family Medicine

## 2019-01-10 ENCOUNTER — Encounter: Payer: Self-pay | Admitting: Family Medicine

## 2019-01-20 ENCOUNTER — Ambulatory Visit (INDEPENDENT_AMBULATORY_CARE_PROVIDER_SITE_OTHER): Payer: 59 | Admitting: Family Medicine

## 2019-01-20 ENCOUNTER — Encounter: Payer: Self-pay | Admitting: Family Medicine

## 2019-01-20 ENCOUNTER — Telehealth: Payer: Self-pay | Admitting: *Deleted

## 2019-01-20 VITALS — BP 154/88 | HR 90

## 2019-01-20 DIAGNOSIS — I10 Essential (primary) hypertension: Secondary | ICD-10-CM

## 2019-01-20 DIAGNOSIS — Z20822 Contact with and (suspected) exposure to covid-19: Secondary | ICD-10-CM

## 2019-01-20 DIAGNOSIS — R5383 Other fatigue: Secondary | ICD-10-CM | POA: Diagnosis not present

## 2019-01-20 LAB — CBC
HCT: 39.7 % (ref 36.0–46.0)
Hemoglobin: 13.5 g/dL (ref 12.0–15.0)
MCHC: 34.2 g/dL (ref 30.0–36.0)
MCV: 91.5 fl (ref 78.0–100.0)
Platelets: 273 10*3/uL (ref 150.0–400.0)
RBC: 4.33 Mil/uL (ref 3.87–5.11)
RDW: 13.5 % (ref 11.5–15.5)
WBC: 8.1 10*3/uL (ref 4.0–10.5)

## 2019-01-20 LAB — TSH: TSH: 1.25 u[IU]/mL (ref 0.35–4.50)

## 2019-01-20 LAB — COMPREHENSIVE METABOLIC PANEL
ALT: 35 U/L (ref 0–35)
AST: 31 U/L (ref 0–37)
Albumin: 4.4 g/dL (ref 3.5–5.2)
Alkaline Phosphatase: 66 U/L (ref 39–117)
BUN: 11 mg/dL (ref 6–23)
CO2: 31 mEq/L (ref 19–32)
Calcium: 9.5 mg/dL (ref 8.4–10.5)
Chloride: 101 mEq/L (ref 96–112)
Creatinine, Ser: 0.73 mg/dL (ref 0.40–1.20)
GFR: 81.27 mL/min (ref >60.00)
Glucose, Bld: 82 mg/dL (ref 70–99)
Potassium: 3.7 mEq/L (ref 3.5–5.1)
Sodium: 142 mEq/L (ref 135–145)
Total Bilirubin: 0.3 mg/dL (ref 0.2–1.2)
Total Protein: 6.6 g/dL (ref 6.0–8.3)

## 2019-01-20 LAB — IBC + FERRITIN
Ferritin: 44.5 ng/mL (ref 10.0–291.0)
Iron: 85 ug/dL (ref 42–145)
Saturation Ratios: 20.2 % (ref 20.0–50.0)
Transferrin: 301 mg/dL (ref 212.0–360.0)

## 2019-01-20 MED ORDER — AMOXICILLIN-POT CLAVULANATE 875-125 MG PO TABS: 1.0000 | ORAL_TABLET | Freq: Two times a day (BID) | ORAL | 0 refills | Status: DC

## 2019-01-20 MED ORDER — LOSARTAN POTASSIUM 100 MG PO TABS: 100.0000 mg | ORAL_TABLET | Freq: Every day | ORAL | 3 refills | Status: DC

## 2019-01-20 MED ORDER — AZELASTINE HCL 0.1 % NA SOLN: 2.0000 | Freq: Two times a day (BID) | NASAL | 12 refills | Status: DC

## 2019-01-20 NOTE — Assessment & Plan Note (Signed)
Above goal today.  She did not tolerate amlodipine we will stop this today.  Restart losartan 100 mg daily.  Continue Dyazide 37.5-25 once daily.  Check CBC, C met, and TSH today.  Advised home blood pressure monitoring with goal 140/90 or lower.  She will follow-up me in 1 to 2 weeks for blood pressure recheck.

## 2019-01-20 NOTE — Telephone Encounter (Signed)
Patient scheduled for covid testing on 01/23/19 @ [facility] @ 8:15am. Instructions given and order placed

## 2019-01-20 NOTE — Progress Notes (Signed)
   Chief Complaint:  Karen Webster is a 58 y.o. female who presents today with a chief complaint of HTN.   Assessment/Plan:  Sinus congestion We will start course of Augmentin given symptoms of been persistent for a week and are worsening.  Also start Astelin nasal spray.  Given her constellation of symptoms, will also send for COVID-19 testing.  Discussed reasons to return to care or seek emergent care.  Essential hypertension Above goal today.  She did not tolerate amlodipine we will stop this today.  Restart losartan 100 mg daily.  Continue Dyazide 37.5-25 once daily.  Check CBC, C met, and TSH today.  Advised home blood pressure monitoring with goal 140/90 or lower.  She will follow-up me in 1 to 2 weeks for blood pressure recheck.  Fatigue Possibly related to above sinus infection.  Will check CBC, C met, TSH, and iron levels per patient request.    Subjective:  HPI:  # Essential Hypertension Seen 3 months ago for this.  Was elevated at that time we started her on amlodipine 10 mg daily.  Unfortunately blood pressures have continue to be elevated.  She is also on Dyazide 37.5-25 mg once daily. She has also had worsening lower extremity edema over the last few weeks as well. No reported chest pain or shortness of breath.   Sinus Congestion Some drainage and congestion over the past couple of weeks. Associated with cough and sore throat.  No sick contacts.  No treatments tried.  No fevers or chills.  ROS: Per HPI  PMH: She reports that she has never smoked. She has never used smokeless tobacco. She reports current alcohol use. She reports that she does not use drugs.      Objective:  Physical Exam: BP (!) 154/88 (BP Location: Left Arm, Patient Position: Sitting, Cuff Size: Large)   Pulse 90   SpO2 96%   Gen: NAD, resting comfortably HEENT: TMs clear. CV: Regular rate and rhythm with no murmurs appreciated Pulm: Normal work of breathing, clear to auscultation  bilaterally with no crackles, wheezes, or rhonchi GI: Normal bowel sounds present. Soft, Nontender, Nondistended. MSK: No edema, cyanosis, or clubbing noted Skin: Warm, dry Neuro: Grossly normal, moves all extremities Psych: Normal affect and thought content      Estel Tonelli M. [name], MD 01/20/2019 5:17 PM

## 2019-01-20 NOTE — Patient Instructions (Signed)
It was very nice to see you today!  Please start the losartan. Stop the amlodipine.  Please start the Augmentin and astelin.   Keep an eye on your blood pressures and let me know if persistently 140/90 or higher.   We will also get you tested for COVID.   Take care, Dr [name]

## 2019-01-20 NOTE — Telephone Encounter (Signed)
-----   Message from Elmer Bales, [place] sent at 01/20/2019  2:54 PM EDT ----- Regarding: [place] PT Name: Karen Webster  DOB: [birth year]  MRN: [id number]  Reason for test: Cough, congestion, suspected COVID   Insurance: [facility]  ID: [id number]

## 2019-01-23 ENCOUNTER — Other Ambulatory Visit: Payer: Self-pay

## 2019-01-23 DIAGNOSIS — Z20822 Contact with and (suspected) exposure to covid-19: Secondary | ICD-10-CM

## 2019-01-23 NOTE — Progress Notes (Signed)
Please inform patient of the following:  All of her blood work is NORMAL. She has no signs of anemia or thyroid issues.   Would like for her to let us know if her fatigue is not improving with the change in her BP meds.  [name]. [name], MD 01/23/2019 8:16 AM

## 2019-01-28 LAB — NOVEL CORONAVIRUS, NAA: SARS-CoV-2, NAA: NOT DETECTED

## 2019-01-31 ENCOUNTER — Telehealth: Payer: Self-pay | Admitting: Family Medicine

## 2019-01-31 NOTE — Telephone Encounter (Signed)
Ok to send in doxycycline 100mg  biid x 7 days. Also can send in prednisone 50mg  x5 days if she wants to try that as well.  [name]. [name], MD 01/31/2019 3:22 PM

## 2019-01-31 NOTE — Telephone Encounter (Signed)
Please advise 

## 2019-01-31 NOTE — Telephone Encounter (Signed)
See note  Copied from [place] [id number]. Topic: General - Other >> Jan 31, 2019 12:57 PM Mcneil, Ja-Kwan wrote: Reason for CRM: Pt stated she is in [place], [place] and she would like a stronger antibiotic to be sent to [facility] at [facility]. Pt requests call back to advise when the Rx has been sent

## 2019-02-01 MED ORDER — LEVOFLOXACIN 500 MG PO TABS: 500.0000 mg | ORAL_TABLET | Freq: Every day | ORAL | 0 refills | Status: DC

## 2019-02-01 MED ORDER — PREDNISONE 50 MG PO TABS: 50.0000 mg | ORAL_TABLET | Freq: Every day | ORAL | 0 refills | Status: AC

## 2019-02-01 NOTE — Telephone Encounter (Signed)
Noted  

## 2019-02-01 NOTE — Telephone Encounter (Signed)
Please advise on sending in the doxycycline, Dr. [name] has approved below. Patient call back to check the status.

## 2019-02-01 NOTE — Addendum Note (Signed)
Addended by: Jasper Loser on: 02/01/2019 02:33 PM   Modules accepted: Orders

## 2019-02-01 NOTE — Telephone Encounter (Signed)
See note

## 2019-02-01 NOTE — Telephone Encounter (Signed)
Pt called in to check the status of this request.  She would like a call back from the nurse.

## 2019-02-01 NOTE — Telephone Encounter (Signed)
Pt has tetracycline allergy. Spoke with Dr. [name] who provided verbal order to send in Levaquin 500 mg once daily for 7 days.

## 2019-02-15 ENCOUNTER — Other Ambulatory Visit: Payer: Self-pay

## 2019-02-16 ENCOUNTER — Encounter: Payer: Self-pay | Admitting: Obstetrics & Gynecology

## 2019-02-16 ENCOUNTER — Ambulatory Visit (INDEPENDENT_AMBULATORY_CARE_PROVIDER_SITE_OTHER): Payer: 59 | Admitting: Obstetrics & Gynecology

## 2019-02-16 ENCOUNTER — Other Ambulatory Visit: Payer: Self-pay

## 2019-02-16 VITALS — BP 124/76 | Wt 239.0 lb

## 2019-02-16 DIAGNOSIS — D219 Benign neoplasm of connective and other soft tissue, unspecified: Secondary | ICD-10-CM | POA: Diagnosis not present

## 2019-02-16 DIAGNOSIS — Z3041 Encounter for surveillance of contraceptive pills: Secondary | ICD-10-CM | POA: Diagnosis not present

## 2019-02-16 DIAGNOSIS — N951 Menopausal and female climacteric states: Secondary | ICD-10-CM | POA: Diagnosis not present

## 2019-02-16 DIAGNOSIS — Z01419 Encounter for gynecological examination (general) (routine) without abnormal findings: Secondary | ICD-10-CM | POA: Diagnosis not present

## 2019-02-16 DIAGNOSIS — Z6838 Body mass index (BMI) 38.0-38.9, adult: Secondary | ICD-10-CM

## 2019-02-16 DIAGNOSIS — E6609 Other obesity due to excess calories: Secondary | ICD-10-CM

## 2019-02-16 MED ORDER — PROGESTERONE MICRONIZED 100 MG PO CAPS: 100.0000 mg | ORAL_CAPSULE | Freq: Every day | ORAL | 4 refills | Status: DC

## 2019-02-16 MED ORDER — NORETHINDRONE 0.35 MG PO TABS: 1.0000 | ORAL_TABLET | Freq: Every day | ORAL | 4 refills | Status: DC

## 2019-02-16 NOTE — Progress Notes (Signed)
Karen Webster 1961/06/25 [id number]   History:    58 y.o. G1P1L1 Married  RP:  Established patient presenting for annual gyn exam   HPI: Probably menopausal with no menses on the progestin only birth control pill and Prometrium 100 mg at bedtime.  No pelvic pain, but discomfort more frequently with intercourse recently.  Known uterine fibroids.  Last pelvic ultrasound April 2019.  Urine and bowel movements normal.  Breast normal.  Body mass index 38.58.  [place] the beach.  [place] the beach since [place] 2020.  Health labs with family physician.  Past medical history,surgical history, family history and social history were all reviewed and documented in the EPIC chart.  Gynecologic History No LMP recorded. Patient is premenopausal. Contraception: oral progesterone-only contraceptive Last Pap: 2018. Results were: Normal per patient Last mammogram: 01/2016 Negative Bone Density: Never Colonoscopy: About 2 yrs ago  Obstetric History OB History  Gravida Para Term Preterm AB Living  1 1       1   SAB TAB Ectopic Multiple Live Births               # Outcome Date GA Lbr Len/2nd Weight Sex Delivery Anes PTL Lv  1 Para              ROS: A ROS was performed and pertinent positives and negatives are included in the history.  GENERAL: No fevers or chills. HEENT: No change in vision, no earache, sore throat or sinus congestion. NECK: No pain or stiffness. CARDIOVASCULAR: No chest pain or pressure. No palpitations. PULMONARY: No shortness of breath, cough or wheeze. GASTROINTESTINAL: No abdominal pain, nausea, vomiting or diarrhea, melena or bright red blood per rectum. GENITOURINARY: No urinary frequency, urgency, hesitancy or dysuria. MUSCULOSKELETAL: No joint or muscle pain, no back pain, no recent trauma. DERMATOLOGIC: No rash, no itching, no lesions. ENDOCRINE: No polyuria, polydipsia, no heat or cold intolerance. No recent change in weight. HEMATOLOGICAL: No anemia or  easy bruising or bleeding. NEUROLOGIC: No headache, seizures, numbness, tingling or weakness. PSYCHIATRIC: No depression, no loss of interest in normal activity or change in sleep pattern.     Exam:   BP 124/76   Wt 239 lb (108.4 kg)   BMI 38.58 kg/m   Body mass index is 38.58 kg/m.  General appearance : Well developed well nourished female. No acute distress HEENT: Eyes: no retinal hemorrhage or exudates,  Neck supple, trachea midline, no carotid bruits, no thyroidmegaly Lungs: Clear to auscultation, no rhonchi or wheezes, or rib retractions  Heart: Regular rate and rhythm, no murmurs or gallops Breast:Examined in sitting and supine position were symmetrical in appearance, no palpable masses or tenderness,  no skin retraction, no nipple inversion, no nipple discharge, no skin discoloration, no axillary or supraclavicular lymphadenopathy Abdomen: no palpable masses or tenderness, no rebound or guarding Extremities: no edema or skin discoloration or tenderness  Pelvic: Vulva: Normal             Vagina: No gross lesions or discharge  Cervix: No gross lesions or discharge.  Pap reflex done.  Uterus  RV, increased volume, nodular, hard with Fibroids, non-tender and mobile  Adnexa  Without masses or tenderness  Anus: Normal   Assessment/Plan:  58 y.o. female for annual exam   1. Encounter for routine gynecological examination with Papanicolaou smear of cervix Gynecologic exam with known uterine fibroids.  Pap reflex done on the cervix.  Breast exam normal.  Will schedule a screening  mammogram now at the breast center.  Colonoscopy about 2 years ago.  Health labs with family physician.  2. Perimenopausal Improved mood and sleep on Prometrium 100 mg at bedtime.  No contraindication to continue.  Prometrium represcribed.  Will stop Prometrium and the progestin only pill x1 week before next annual exam in order to confirm menopause with an [facility]. Recommend vitamin D supplements, calcium 1200  mg daily and regular weightbearing physical activities.  3. Encounter for surveillance of contraceptive pills We will continue with the progestin only birth control pill.  Will confirm menopause with an [place] after 1 week off the progestins next year.  Prescription sent to pharmacy.  4. Fibroids Known uterine fibroids with some discomfort with intercourse.  We will follow-up with a pelvic ultrasound to assess stability of the fibroids and rule out ovarian pathology. - US Transvaginal Non-OB; Future  5. Class 2 obesity due to excess calories without serious comorbidity with body mass index (BMI) of 38.0 to 38.9 in adult Recommend a lower calorie/carb diet such as [employer].  Aerobic physical activities 5 times a week and weightlifting every 2 days.  Other orders - norethindrone (MICRONOR) 0.35 MG tablet; Take 1 tablet (0.35 mg total) by mouth daily. - progesterone (PROMETRIUM) 100 MG capsule; Take 1 capsule (100 mg total) by mouth at bedtime.  Counseling on above issues and coordination of care more than 50% for 10 minutes.  Princess Bruins MD, 1:46 PM 02/16/2019

## 2019-02-16 NOTE — Patient Instructions (Signed)
1. Encounter for routine gynecological examination with Papanicolaou smear of cervix Gynecologic exam with known uterine fibroids.  Pap reflex done on the cervix.  Breast exam normal.  Will schedule a screening mammogram now at the breast center.  Colonoscopy about 2 years ago.  Health labs with family physician.  2. Perimenopausal Improved mood and sleep on Prometrium 100 mg at bedtime.  No contraindication to continue.  Prometrium represcribed.  Will stop Prometrium and the progestin only pill x1 week before next annual exam in order to confirm menopause with an [facility]. Recommend vitamin D supplements, calcium 1200 mg daily and regular weightbearing physical activities.  3. Encounter for surveillance of contraceptive pills We will continue with the progestin only birth control pill.  Will confirm menopause with an [place] after 1 week off the progestins next year.  Prescription sent to pharmacy.  4. Fibroids Known uterine fibroids with some discomfort with intercourse.  We will follow-up with a pelvic ultrasound to assess stability of the fibroids and rule out ovarian pathology. - US Transvaginal Non-OB; Future  5. Class 2 obesity due to excess calories without serious comorbidity with body mass index (BMI) of 38.0 to 38.9 in adult Recommend a lower calorie/carb diet such as [employer].  Aerobic physical activities 5 times a week and weightlifting every 2 days.  Other orders - norethindrone (MICRONOR) 0.35 MG tablet; Take 1 tablet (0.35 mg total) by mouth daily. - progesterone (PROMETRIUM) 100 MG capsule; Take 1 capsule (100 mg total) by mouth at bedtime.  Karen Webster, it was a pleasure seeing you today!  I will inform you of your results as soon as they are available.

## 2019-02-21 LAB — PAP IG W/ RFLX HPV ASCU

## 2019-03-30 ENCOUNTER — Other Ambulatory Visit: Payer: 59

## 2019-03-30 ENCOUNTER — Ambulatory Visit: Payer: 59 | Admitting: Obstetrics & Gynecology

## 2019-09-12 ENCOUNTER — Other Ambulatory Visit: Payer: Self-pay | Admitting: Emergency Medicine

## 2019-09-12 MED ORDER — TRIAMTERENE-HCTZ 37.5-25 MG PO CAPS: 1.0000 | ORAL_CAPSULE | Freq: Every day | ORAL | 3 refills | Status: DC

## 2019-09-12 MED ORDER — OMEPRAZOLE 20 MG PO CPDR: 20.0000 mg | DELAYED_RELEASE_CAPSULE | Freq: Every day | ORAL | 3 refills | Status: DC

## 2019-09-19 ENCOUNTER — Ambulatory Visit (INDEPENDENT_AMBULATORY_CARE_PROVIDER_SITE_OTHER): Payer: 59 | Admitting: Family Medicine

## 2019-09-19 ENCOUNTER — Other Ambulatory Visit: Payer: Self-pay

## 2019-09-19 ENCOUNTER — Encounter: Payer: Self-pay | Admitting: Family Medicine

## 2019-09-19 VITALS — BP 118/72 | HR 66 | Temp 98.2°F | Ht 66.0 in | Wt 245.2 lb

## 2019-09-19 DIAGNOSIS — F321 Major depressive disorder, single episode, moderate: Secondary | ICD-10-CM

## 2019-09-19 DIAGNOSIS — I10 Essential (primary) hypertension: Secondary | ICD-10-CM

## 2019-09-19 DIAGNOSIS — R0602 Shortness of breath: Secondary | ICD-10-CM

## 2019-09-19 DIAGNOSIS — R739 Hyperglycemia, unspecified: Secondary | ICD-10-CM

## 2019-09-19 DIAGNOSIS — E785 Hyperlipidemia, unspecified: Secondary | ICD-10-CM

## 2019-09-19 DIAGNOSIS — F339 Major depressive disorder, recurrent, unspecified: Secondary | ICD-10-CM | POA: Insufficient documentation

## 2019-09-19 DIAGNOSIS — Z0001 Encounter for general adult medical examination with abnormal findings: Secondary | ICD-10-CM

## 2019-09-19 DIAGNOSIS — K219 Gastro-esophageal reflux disease without esophagitis: Secondary | ICD-10-CM

## 2019-09-19 LAB — LIPID PANEL
Cholesterol: 199 mg/dL (ref 0–200)
HDL: 43.6 mg/dL (ref >39.00)
LDL Cholesterol: 127 mg/dL — ABNORMAL HIGH (ref 0–99)
NonHDL: 155.53
Total CHOL/HDL Ratio: 5
Triglycerides: 141 mg/dL (ref 0.0–149.0)
VLDL: 28.2 mg/dL (ref 0.0–40.0)

## 2019-09-19 LAB — CBC
HCT: 39.8 % (ref 36.0–46.0)
Hemoglobin: 13.4 g/dL (ref 12.0–15.0)
MCHC: 33.7 g/dL (ref 30.0–36.0)
MCV: 90.9 fl (ref 78.0–100.0)
Platelets: 281 10*3/uL (ref 150.0–400.0)
RBC: 4.38 Mil/uL (ref 3.87–5.11)
RDW: 13.2 % (ref 11.5–15.5)
WBC: 6.8 10*3/uL (ref 4.0–10.5)

## 2019-09-19 LAB — COMPREHENSIVE METABOLIC PANEL
ALT: 13 U/L (ref 0–35)
AST: 13 U/L (ref 0–37)
Albumin: 4.2 g/dL (ref 3.5–5.2)
Alkaline Phosphatase: 68 U/L (ref 39–117)
BUN: 15 mg/dL (ref 6–23)
CO2: 32 mEq/L (ref 19–32)
Calcium: 9.5 mg/dL (ref 8.4–10.5)
Chloride: 102 mEq/L (ref 96–112)
Creatinine, Ser: 0.83 mg/dL (ref 0.40–1.20)
GFR: 69.92 mL/min (ref >60.00)
Glucose, Bld: 100 mg/dL — ABNORMAL HIGH (ref 70–99)
Potassium: 4.1 mEq/L (ref 3.5–5.1)
Sodium: 140 mEq/L (ref 135–145)
Total Bilirubin: 0.5 mg/dL (ref 0.2–1.2)
Total Protein: 6.7 g/dL (ref 6.0–8.3)

## 2019-09-19 LAB — HEMOGLOBIN A1C: Hgb A1c MFr Bld: 6.1 % (ref 4.6–6.5)

## 2019-09-19 LAB — TSH: TSH: 1.5 u[IU]/mL (ref 0.35–4.50)

## 2019-09-19 MED ORDER — BUPROPION HCL ER (XL) 150 MG PO TB24: 150.0000 mg | ORAL_TABLET | Freq: Every day | ORAL | 5 refills | Status: DC

## 2019-09-19 NOTE — Progress Notes (Signed)
Chief Complaint:  Karen Webster is a 59 y.o. female who presents today for her annual comprehensive physical exam.    Assessment/Plan:  New/Acute Problems: Shortness of Breath Likely due to weight gain however has few risk factors including dyslipidemia and hypertension in addition to family history.  Will send for stress testing.  Discussed reasons to seek emergent care.  Chronic Problems Addressed Today: Depression, major, single episode, moderate ([place]) With concurrent binge eating.  She has previously been on Lexapro.  Will start Wellbutrin today.  Discussed potential side effects.  She will follow-up me in a couple weeks via MyChart.  Morbid obesity ([place]) Up about 15 pounds since our last visit.  Likely combination of inability to exercise due to Covid and staying at home more with several dietary discretions.  Couple will have some improvement as we treat her depression with Wellbutrin.  May benefit from trial of GLP-1 agonist in the future.  Dyslipidemia Check CBC, C met, TSH, lipid panel.  Gastroesophageal reflux disease without esophagitis Stable.  Continue omeprazole 20 mg daily.  Essential hypertension At goal.  Continue Dyazide 37.5-25 once daily and losartan 100 mg daily.  Check CBC, C met, TSH today.   Preventative Healthcare: Up-to-date on colonoscopy-we will need to get prior records.  Declined flu vaccine.  Discussed Covid vaccine-patient would like to hold off for the time being.  Patient Counseling(The following topics were reviewed and/or handout was given):  -Nutrition: Stressed importance of moderation in sodium/caffeine intake, saturated fat and cholesterol, caloric balance, sufficient intake of fresh fruits, vegetables, and fiber.  -Stressed the importance of regular exercise.   -Substance Abuse: Discussed cessation/primary prevention of tobacco, alcohol, or other drug use; driving or other dangerous activities under the influence; availability of  treatment for abuse.   -Injury prevention: Discussed safety belts, safety helmets, smoke detector, smoking near bedding or upholstery.   -Sexuality: Discussed sexually transmitted diseases, partner selection, use of condoms, avoidance of unintended pregnancy and contraceptive alternatives.   -Dental health: Discussed importance of regular tooth brushing, flossing, and dental visits.  -Health maintenance and immunizations reviewed. Please refer to Health maintenance section.  Return to care in 1 year for next preventative visit.     Subjective:  HPI:  She has several issues that she would like to talk about today outlined below  She has had some shortness of breath over the last couple of months.  Only notices it when climbing flight of stairs.  No chest pain.  Shortness of breath subsides with rest.  Worse with carrying groceries as well.  Has a strong family history of cardiac disease and is concerned that she may be developing something as well.  No current symptoms.  She has also had increasing issues with depression over the last 6 months or so.  States that she is an outgoing person and that the social isolation has been very difficult for her.  She has had some associated weight gain as well.  She has had issues with weight for most of her life.  She admits to binge eating and stress eating.  No reported SI or HI.  She has also had some increasing sinus congestion.  This is a chronic issue for her.  She occasionally uses Astelin as needed which helps modestly.  She has some increased sensation of fullness in her right ear.  Symptoms seem to be worse due to the weather recently.  Lifestyle Diet: None. Occasionally binge eats.  Exercise: Limited due to COVID.  Depression screen PHQ 2/9 09/14/2018  Decreased Interest 0  Down, Depressed, Hopeless 0  PHQ - 2 Score 0   Health Maintenance Due  Topic Date Due  . Hepatitis C Screening  14-May-1961  . HIV Screening  11/10/1973     ROS:  Per HPI, otherwise a complete review of systems was negative.   PMH:  The following were reviewed and entered/updated in epic: Past Medical History:  Diagnosis Date  . Arthritis    knees  . GERD (gastroesophageal reflux disease)   . Hypertension   . Seasonal allergies   . SUI (stress urinary incontinence, female)    Patient Active Problem List   Diagnosis Date Noted  . Depression, major, single episode, moderate ([place]) 09/19/2019  . Morbid obesity ([place]) 09/19/2019  . Dyslipidemia 09/16/2018  . Essential hypertension 09/14/2018  . Gastroesophageal reflux disease without esophagitis 09/14/2018  . Menopausal symptoms 09/14/2018  . Cervical radiculopathy secondary to DDD 08/25/2018   Past Surgical History:  Procedure Laterality Date  . CATARACT EXTRACTION, BILATERAL    . CHOLECYSTECTOMY    . COLONOSCOPY  2018  . HYSTEROSCOPY N/A 11/17/2017   Procedure: HYSTEROSCOPY FOR REMOVAL OF IUD;  Surgeon: Princess Bruins, MD;  Location: [facility];  Service: Gynecology;  Laterality: N/A;  . KNEE SURGERY     Right knee, Dr Reynaldo Minium  . NASAL SEPTUM SURGERY    . WISDOM TOOTH EXTRACTION      Family History  Problem Relation Age of Onset  . Hypertension Mother   . Hypertension Father   . Breast cancer Paternal Grandmother   . Cancer Paternal Grandmother   . Dementia Maternal Grandmother   . Heart disease Maternal Grandfather   . Heart disease Paternal Grandfather   . Colon cancer Neg Hx     Medications- reviewed and updated Current Outpatient Medications  Medication Sig Dispense Refill  . azelastine (ASTELIN) 0.1 % nasal spray Place 2 sprays into both nostrils 2 (two) times daily. Use in each nostril as directed 30 mL 12  . Cholecalciferol (VITAMIN D3 PO) Take 1,000 Units by mouth.    . losartan (COZAAR) 100 MG tablet Take 1 tablet (100 mg total) by mouth daily. 90 tablet 3  . Magnesium 200 MG TABS Take 400 mg by mouth.    . naproxen (NAPROSYN) 500 MG tablet naproxen  500 mg tablet  TAKE 1 TABLET BY MOUTH TWICE A DAY    . norethindrone (MICRONOR) 0.35 MG tablet Take 1 tablet (0.35 mg total) by mouth daily. 3 Package 4  . omeprazole (PRILOSEC) 20 MG capsule Take 1 capsule (20 mg total) by mouth daily. 90 capsule 3  . progesterone (PROMETRIUM) 100 MG capsule Take 1 capsule (100 mg total) by mouth at bedtime. 90 capsule 4  . triamterene-hydrochlorothiazide (DYAZIDE) 37.5-25 MG capsule Take 1 each (1 capsule total) by mouth daily. 90 capsule 3  . buPROPion (WELLBUTRIN XL) 150 MG 24 hr tablet Take 1 tablet (150 mg total) by mouth daily. 30 tablet 5   No current facility-administered medications for this visit.    Allergies-reviewed and updated Allergies  Allergen Reactions  . Nitrofurantoin Hives and Rash  . Ciprocin-Fluocin-Procin [Fluocinolone]   . Macrodantin [Nitrofurantoin Macrocrystal] Hives  . Tetracycline Hives  . Tetracyclines & Related Hives  . Bactrim [Sulfamethoxazole-Trimethoprim] Rash    Social History   Socioeconomic History  . Marital status: Widowed    Spouse name: Not on file  . Number of children: Not on file  . Years of  education: Not on file  . Highest education level: Not on file  Occupational History  . Not on file  Tobacco Use  . Smoking status: Never Smoker  . Smokeless tobacco: Never Used  Substance and Sexual Activity  . Alcohol use: Yes    Comment: SOCIAL   . Drug use: Never  . Sexual activity: Yes    Partners: Male    Birth control/protection: None, Pill    Comment: 1st intercourse- 50, partners-6, current partner- 58 ys   Other Topics Concern  . Not on file  Social History Narrative  . Not on file   Social Determinants of Health   Financial Resource Strain:   . Difficulty of Paying Living Expenses: Not on file  Food Insecurity:   . Worried About Charity fundraiser in the Last Year: Not on file  . Ran Out of Food in the Last Year: Not on file  Transportation Needs:   . Lack of Transportation  (Medical): Not on file  . Lack of Transportation (Non-Medical): Not on file  Physical Activity:   . Days of Exercise per Week: Not on file  . Minutes of Exercise per Session: Not on file  Stress:   . Feeling of Stress : Not on file  Social Connections:   . Frequency of Communication with Friends and Family: Not on file  . Frequency of Social Gatherings with Friends and Family: Not on file  . Attends Religious Services: Not on file  . Active Member of Clubs or Organizations: Not on file  . Attends Archivist Meetings: Not on file  . Marital Status: Not on file        Objective:  Physical Exam: BP 118/72   Pulse 66   Temp 98.2 F (36.8 C)   Ht '5\' 6"'$  (1.676 m)   Wt 245 lb 4 oz (111.2 kg)   SpO2 96%   BMI 39.58 kg/m   Body mass index is 39.58 kg/m. Wt Readings from Last 3 Encounters:  09/19/19 245 lb 4 oz (111.2 kg)  02/16/19 239 lb (108.4 kg)  10/31/18 230 lb (104.3 kg)  Gen: NAD, resting comfortably HEENT: TMs normal bilaterally. OP clear. No thyromegaly noted.  CV: RRR with no murmurs appreciated Pulm: NWOB, CTAB with no crackles, wheezes, or rhonchi GI: Normal bowel sounds present. Soft, Nontender, Nondistended. MSK: no edema, cyanosis, or clubbing noted Skin: warm, dry Neuro: CN2-12 grossly intact. Strength 5/5 in upper and lower extremities. Reflexes symmetric and intact bilaterally.  Psych: Normal affect and thought content     Shaquill Iseman M. [name], MD 09/19/2019 9:37 AM

## 2019-09-19 NOTE — Progress Notes (Signed)
Please inform patient of the following:  Blood sugar and cholesterol numbers are both borderline.  Does not need to start medications at the time being.  Would like for her to continue working on diet and exercise and we can recheck in a year or so.  The rest of her labs are all normal.

## 2019-09-19 NOTE — Assessment & Plan Note (Signed)
Stable.  Continue omeprazole 20mg daily

## 2019-09-19 NOTE — Assessment & Plan Note (Signed)
Up about 15 pounds since our last visit.  Likely combination of inability to exercise due to Covid and staying at home more with several dietary discretions.  Couple will have some improvement as we treat her depression with Wellbutrin.  May benefit from trial of GLP-1 agonist in the future.

## 2019-09-19 NOTE — Assessment & Plan Note (Signed)
At goal.  Continue Dyazide 37.5-25 once daily and losartan 100 mg daily.  Check CBC, C met, TSH today.

## 2019-09-19 NOTE — Patient Instructions (Signed)
It was very nice to see you today!  Please start the Wellbutrin.  I think this will help with your mood and overeating.  We will get a stress test to make sure that everything is okay with your heart.  We can discuss medications to help with weight loss later.  We will check blood work today.  Come back in 1 year for your next physical, or sooner if needed.  Take care, Dr [name]  Please try these tips to maintain a healthy lifestyle:   Eat at least 3 REAL meals and 1-2 snacks per day.  Aim for no more than 5 hours between eating.  If you eat breakfast, please do so within one hour of getting up.    Each meal should contain half fruits/vegetables, one quarter protein, and one quarter carbs (no bigger than a computer mouse)   Cut down on sweet beverages. This includes juice, soda, and sweet tea.     Drink at least 1 glass of water with each meal and aim for at least 8 glasses per day   Exercise at least 150 minutes every week.    Preventive Care 63-31 Years Old, Female Preventive care refers to visits with your health care provider and lifestyle choices that can promote health and wellness. This includes:  A yearly physical exam. This may also be called an annual well check.  Regular dental visits and eye exams.  Immunizations.  Screening for certain conditions.  Healthy lifestyle choices, such as eating a healthy diet, getting regular exercise, not using drugs or products that contain nicotine and tobacco, and limiting alcohol use. What can I expect for my preventive care visit? Physical exam Your health care provider will check your:  Height and weight. This may be used to calculate body mass index (BMI), which tells if you are at a healthy weight.  Heart rate and blood pressure.  Skin for abnormal spots. Counseling Your health care provider may ask you questions about your:  Alcohol, tobacco, and drug use.  Emotional well-being.  Home and relationship  well-being.  Sexual activity.  Eating habits.  Work and work Statistician.  Method of birth control.  Menstrual cycle.  Pregnancy history. What immunizations do I need?  Influenza (flu) vaccine  This is recommended every year. Tetanus, diphtheria, and pertussis (Tdap) vaccine  You may need a Td booster every 10 years. Varicella (chickenpox) vaccine  You may need this if you have not been vaccinated. Zoster (shingles) vaccine  You may need this after age 64. Measles, mumps, and rubella (MMR) vaccine  You may need at least one dose of MMR if you were born in 1957 or later. You may also need a second dose. Pneumococcal conjugate (PCV13) vaccine  You may need this if you have certain conditions and were not previously vaccinated. Pneumococcal polysaccharide (PPSV23) vaccine  You may need one or two doses if you smoke cigarettes or if you have certain conditions. Meningococcal conjugate (MenACWY) vaccine  You may need this if you have certain conditions. Hepatitis A vaccine  You may need this if you have certain conditions or if you travel or work in places where you may be exposed to hepatitis A. Hepatitis B vaccine  You may need this if you have certain conditions or if you travel or work in places where you may be exposed to hepatitis B. Haemophilus influenzae type b (Hib) vaccine  You may need this if you have certain conditions. Human papillomavirus (HPV) vaccine  If  recommended by your health care provider, you may need three doses over 6 months. You may receive vaccines as individual doses or as more than one vaccine together in one shot (combination vaccines). Talk with your health care provider about the risks and benefits of combination vaccines. What tests do I need? Blood tests  Lipid and cholesterol levels. These may be checked every 5 years, or more frequently if you are over 59 years old.  Hepatitis C test.  Hepatitis B test. Screening  Lung  cancer screening. You may have this screening every year starting at age 27 if you have a 30-pack-year history of smoking and currently smoke or have quit within the past 15 years.  Colorectal cancer screening. All adults should have this screening starting at age 28 and continuing until age 65. Your health care provider may recommend screening at age 59 if you are at increased risk. You will have tests every 1-10 years, depending on your results and the type of screening test.  Diabetes screening. This is done by checking your blood sugar (glucose) after you have not eaten for a while (fasting). You may have this done every 1-3 years.  Mammogram. This may be done every 1-2 years. Talk with your health care provider about when you should start having regular mammograms. This may depend on whether you have a family history of breast cancer.  BRCA-related cancer screening. This may be done if you have a family history of breast, ovarian, tubal, or peritoneal cancers.  Pelvic exam and Pap test. This may be done every 3 years starting at age 88. Starting at age 39, this may be done every 5 years if you have a Pap test in combination with an HPV test. Other tests  Sexually transmitted disease (STD) testing.  Bone density scan. This is done to screen for osteoporosis. You may have this scan if you are at high risk for osteoporosis. Follow these instructions at home: Eating and drinking  Eat a diet that includes fresh fruits and vegetables, whole grains, lean protein, and low-fat dairy.  Take vitamin and mineral supplements as recommended by your health care provider.  Do not drink alcohol if: ? Your health care provider tells you not to drink. ? You are pregnant, may be pregnant, or are planning to become pregnant.  If you drink alcohol: ? Limit how much you have to 0-1 drink a day. ? Be aware of how much alcohol is in your drink. In the [place] one drink equals one 12 oz bottle of beer (355  mL), one 5 oz glass of wine (148 mL), or one 1 oz glass of hard liquor (44 mL). Lifestyle  Take daily care of your teeth and gums.  Stay active. Exercise for at least 30 minutes on 5 or more days each week.  Do not use any products that contain nicotine or tobacco, such as cigarettes, e-cigarettes, and chewing tobacco. If you need help quitting, ask your health care provider.  If you are sexually active, practice safe sex. Use a condom or other form of birth control (contraception) in order to prevent pregnancy and STIs (sexually transmitted infections).  If told by your health care provider, take low-dose aspirin daily starting at age 61. What's next?  Visit your health care provider once a year for a well check visit.  Ask your health care provider how often you should have your eyes and teeth checked.  Stay up to date on all vaccines. This information is not  intended to replace advice given to you by your health care provider. Make sure you discuss any questions you have with your health care provider. Document Revised: 03/31/2018 Document Reviewed: 03/31/2018 Elsevier Patient Education  2020 [employer].

## 2019-09-19 NOTE — Assessment & Plan Note (Signed)
Check CBC, C met, TSH, lipid panel. 

## 2019-09-19 NOTE — Assessment & Plan Note (Signed)
With concurrent binge eating.  She has previously been on Lexapro.  Will start Wellbutrin today.  Discussed potential side effects.  She will follow-up me in a couple weeks via MyChart.

## 2019-10-04 ENCOUNTER — Encounter: Payer: Self-pay | Admitting: Family Medicine

## 2019-10-09 ENCOUNTER — Other Ambulatory Visit: Payer: Self-pay

## 2019-10-09 MED ORDER — OZEMPIC (0.25 OR 0.5 MG/DOSE) 2 MG/1.5ML ~~LOC~~ SOPN: 0.2500 mg | PEN_INJECTOR | SUBCUTANEOUS | 2 refills | Status: DC

## 2019-10-09 NOTE — Telephone Encounter (Signed)
No Problem :) 

## 2019-10-11 ENCOUNTER — Other Ambulatory Visit: Payer: Self-pay | Admitting: Family Medicine

## 2019-11-27 ENCOUNTER — Other Ambulatory Visit: Payer: Self-pay | Admitting: Family Medicine

## 2019-11-27 DIAGNOSIS — Z1231 Encounter for screening mammogram for malignant neoplasm of breast: Secondary | ICD-10-CM

## 2019-12-05 ENCOUNTER — Other Ambulatory Visit: Payer: Self-pay | Admitting: Family Medicine

## 2019-12-05 ENCOUNTER — Ambulatory Visit
Admission: RE | Admit: 2019-12-05 | Discharge: 2019-12-05 | Disposition: A | Discharge status: Home / Self Care | Payer: 59 | Source: Ambulatory Visit | Attending: Family Medicine | Admitting: Family Medicine

## 2019-12-05 ENCOUNTER — Other Ambulatory Visit: Payer: Self-pay

## 2019-12-05 DIAGNOSIS — N6320 Unspecified lump in the left breast, unspecified quadrant: Secondary | ICD-10-CM

## 2019-12-05 DIAGNOSIS — Z1231 Encounter for screening mammogram for malignant neoplasm of breast: Secondary | ICD-10-CM

## 2019-12-12 ENCOUNTER — Ambulatory Visit
Admission: RE | Admit: 2019-12-12 | Discharge: 2019-12-12 | Disposition: A | Discharge status: Home / Self Care | Payer: 59 | Source: Ambulatory Visit | Attending: Family Medicine | Admitting: Family Medicine

## 2019-12-12 ENCOUNTER — Other Ambulatory Visit: Payer: Self-pay

## 2019-12-12 DIAGNOSIS — N6320 Unspecified lump in the left breast, unspecified quadrant: Secondary | ICD-10-CM

## 2019-12-31 ENCOUNTER — Other Ambulatory Visit: Payer: Self-pay | Admitting: Family Medicine

## 2020-01-06 ENCOUNTER — Other Ambulatory Visit: Payer: Self-pay | Admitting: Family Medicine

## 2020-01-13 ENCOUNTER — Other Ambulatory Visit: Payer: Self-pay | Admitting: Family Medicine

## 2020-03-14 ENCOUNTER — Telehealth: Payer: Self-pay

## 2020-03-14 ENCOUNTER — Other Ambulatory Visit: Payer: Self-pay | Admitting: Obstetrics & Gynecology

## 2020-03-14 MED ORDER — NORETHINDRONE 0.35 MG PO TABS: 1.0000 | ORAL_TABLET | Freq: Every day | ORAL | 0 refills | Status: DC

## 2020-03-14 MED ORDER — PROGESTERONE MICRONIZED 100 MG PO CAPS
100.0000 mg | ORAL_CAPSULE | Freq: Every day | ORAL | 0 refills | Status: DC
Start: 2020-03-14 — End: 2020-06-14

## 2020-03-14 NOTE — Telephone Encounter (Signed)
Agree with Prometrium 100 mg HS #90, no refill.

## 2020-03-14 NOTE — Telephone Encounter (Signed)
Patient was due for CE in July. She requested refill on her Prometrium. However, she said she cannot schedule a visit because she does not have insurance currently.  Not sure when. Asked for 90 days refill.

## 2020-03-14 NOTE — Addendum Note (Signed)
Addended by: Ramond Craver on: 03/14/2020 03:41 PM   Modules accepted: Orders

## 2020-03-14 NOTE — Telephone Encounter (Signed)
I spoke with patient and let her know Dr. Marguerita Merles agreed to 90 days supply of Prometrium but she would need to have her appt before that runs out.  She said she also needs her Micronor.  I will send both. She scheduled CE with ML for 05/23/20 as she is supposed to be getting insurance soon and think she will have it by then.

## 2020-04-09 ENCOUNTER — Encounter: Payer: Self-pay | Admitting: Family Medicine

## 2020-04-10 ENCOUNTER — Other Ambulatory Visit: Payer: Self-pay | Admitting: Family Medicine

## 2020-04-18 ENCOUNTER — Encounter: Payer: Self-pay | Admitting: Family Medicine

## 2020-04-18 NOTE — Telephone Encounter (Signed)
Letter done pt notified

## 2020-05-23 ENCOUNTER — Encounter: Payer: 59 | Admitting: Obstetrics & Gynecology

## 2020-06-14 ENCOUNTER — Other Ambulatory Visit: Payer: Self-pay | Admitting: Obstetrics & Gynecology

## 2020-06-14 NOTE — Telephone Encounter (Signed)
Past due for CE but she is scheduled for 07/31/20. Appt note said she had rescheduled it due to insurance reasons.

## 2020-06-18 ENCOUNTER — Other Ambulatory Visit: Payer: Self-pay | Admitting: Family Medicine

## 2020-07-31 ENCOUNTER — Encounter: Payer: Self-pay | Admitting: Obstetrics & Gynecology

## 2020-08-26 ENCOUNTER — Other Ambulatory Visit: Payer: Self-pay

## 2020-08-26 ENCOUNTER — Telehealth (INDEPENDENT_AMBULATORY_CARE_PROVIDER_SITE_OTHER): Payer: 59 | Admitting: Internal Medicine

## 2020-08-26 ENCOUNTER — Encounter: Payer: Self-pay | Admitting: Internal Medicine

## 2020-08-26 DIAGNOSIS — J019 Acute sinusitis, unspecified: Secondary | ICD-10-CM

## 2020-08-26 MED ORDER — ALBUTEROL SULFATE HFA 108 (90 BASE) MCG/ACT IN AERS
2.0000 | INHALATION_SPRAY | Freq: Four times a day (QID) | RESPIRATORY_TRACT | 0 refills | Status: DC | PRN
Start: 2020-08-26 — End: 2020-09-20

## 2020-08-26 MED ORDER — AMOXICILLIN-POT CLAVULANATE 875-125 MG PO TABS
1.0000 | ORAL_TABLET | Freq: Two times a day (BID) | ORAL | 0 refills | Status: DC
Start: 2020-08-26 — End: 2020-09-20

## 2020-08-26 MED ORDER — PROMETHAZINE-DM 6.25-15 MG/5ML PO SYRP
5.0000 mL | ORAL_SOLUTION | Freq: Four times a day (QID) | ORAL | 0 refills | Status: DC | PRN
Start: 2020-08-26 — End: 2020-09-20

## 2020-08-26 NOTE — Progress Notes (Signed)
Virtual Visit via telephone note  I connected with Karen Webster on 08/26/20 at  2:45 PM EST by telephone and verified that I am speaking with the correct person using two identifiers.   I discussed the limitations of evaluation and management by telemedicine and the availability of in person appointments. The patient expressed understanding and agreed to proceed.  Present for the visit:  Myself, Dr Billey Gosling, Matthias Hughs.  The patient is currently at home and I am in the office.    No referring provider.    History of Present Illness: She is here for an acute visit for cold symptoms.   Her symptoms started 2 weeks.  She has allergies and typically gets them with weather changes.  Initially she thought that is what her symptoms were-just allergies.  Her symptoms have worsened and persisted and she now thinks she has a sinus infection.  She is experiencing sinus pain and her face is a little swollen.  She states a feeling of fluid in her ears and some ear discomfort.  She states postnasal drip that is causing a cough, hoarseness and headaches.  Medications she has been trying has not helped and her symptoms seem to be getting worse.  She has tried taking otc sinus meds, otc nasal sprays   Review of Systems  Constitutional: Negative for chills and fever.  HENT: Positive for sinus pain. Negative for congestion, ear pain (fluid in ears) and sore throat.        Pnd, hoarseness  Respiratory: Positive for cough (pnd). Negative for shortness of breath and wheezing.   Neurological: Positive for headaches.     Social History   Socioeconomic History  . Marital status: Widowed    Spouse name: Not on file  . Number of children: Not on file  . Years of education: Not on file  . Highest education level: Not on file  Occupational History  . Not on file  Tobacco Use  . Smoking status: Never Smoker  . Smokeless tobacco: Never Used  Vaping Use  . Vaping Use: Never used   Substance and Sexual Activity  . Alcohol use: Yes    Comment: SOCIAL   . Drug use: Never  . Sexual activity: Yes    Partners: Male    Birth control/protection: None, Pill    Comment: 1st intercourse- 31, partners-6, current partner- 19 ys   Other Topics Concern  . Not on file  Social History Narrative  . Not on file   Social Determinants of Health   Financial Resource Strain: Not on file  Food Insecurity: Not on file  Transportation Needs: Not on file  Physical Activity: Not on file  Stress: Not on file  Social Connections: Not on file     Observations/Objective: Appears well in NAD   Assessment and Plan:  Acute sinus infection:. Acute Likely bacterial  Start Augmentin 875-125 mg BID x 10 day Albuterol inhaler-this has helped her in the past and she would like to have 1 on hand in case it gets into her chest Promethazine-DM cough syrup otc cold medications Rest, fluid Call if no improvement    Follow Up Instructions:    I discussed the assessment and treatment plan with the patient. The patient was provided an opportunity to ask questions and all were answered. The patient agreed with the plan and demonstrated an understanding of the instructions.   The patient was advised to call back or seek an in-person evaluation if the symptoms worsen  or if the condition fails to improve as anticipated.  Time spent on telephone call: 10 minutes  Binnie Rail, MD

## 2020-09-03 ENCOUNTER — Encounter: Payer: Self-pay | Admitting: Family Medicine

## 2020-09-03 NOTE — Telephone Encounter (Signed)
Left message to return call to our office at their convenience. Need to schedule appointment with PCP  

## 2020-09-06 ENCOUNTER — Other Ambulatory Visit: Payer: Self-pay | Admitting: Obstetrics & Gynecology

## 2020-09-06 NOTE — Telephone Encounter (Signed)
Annual exam scheduled on 09/09/20

## 2020-09-09 ENCOUNTER — Encounter: Payer: Self-pay | Admitting: Obstetrics & Gynecology

## 2020-09-12 ENCOUNTER — Other Ambulatory Visit: Payer: Self-pay | Admitting: Obstetrics & Gynecology

## 2020-09-13 ENCOUNTER — Telehealth: Payer: Self-pay | Admitting: *Deleted

## 2020-09-13 ENCOUNTER — Other Ambulatory Visit: Payer: Self-pay | Admitting: Family Medicine

## 2020-09-13 MED ORDER — NORETHINDRONE 0.35 MG PO TABS: 1.0000 | ORAL_TABLET | Freq: Every day | ORAL | 0 refills | Status: DC

## 2020-09-13 MED ORDER — PROGESTERONE MICRONIZED 100 MG PO CAPS: 100.0000 mg | ORAL_CAPSULE | Freq: Every day | ORAL | 0 refills | Status: DC

## 2020-09-13 NOTE — Telephone Encounter (Signed)
Patient had canceled annual exam in Feb due to emerg. annual exam scheduled on 11/08/20, needs refill on progesterone 100mg  capsule and Heather 0.35 mg tablet. Rx sent.

## 2020-09-17 ENCOUNTER — Other Ambulatory Visit: Payer: Self-pay | Admitting: Family Medicine

## 2020-09-17 ENCOUNTER — Other Ambulatory Visit: Payer: Self-pay | Admitting: Internal Medicine

## 2020-09-17 NOTE — Telephone Encounter (Signed)
Requesting refills

## 2020-09-20 ENCOUNTER — Encounter: Payer: Self-pay | Admitting: Family Medicine

## 2020-09-20 ENCOUNTER — Ambulatory Visit (INDEPENDENT_AMBULATORY_CARE_PROVIDER_SITE_OTHER): Payer: 59

## 2020-09-20 ENCOUNTER — Ambulatory Visit (INDEPENDENT_AMBULATORY_CARE_PROVIDER_SITE_OTHER): Payer: 59 | Admitting: Family Medicine

## 2020-09-20 ENCOUNTER — Other Ambulatory Visit: Payer: Self-pay

## 2020-09-20 VITALS — BP 119/79 | HR 76 | Temp 98.2°F | Ht 66.0 in | Wt 244.8 lb

## 2020-09-20 DIAGNOSIS — I10 Essential (primary) hypertension: Secondary | ICD-10-CM

## 2020-09-20 DIAGNOSIS — M25551 Pain in right hip: Secondary | ICD-10-CM | POA: Diagnosis not present

## 2020-09-20 DIAGNOSIS — R739 Hyperglycemia, unspecified: Secondary | ICD-10-CM | POA: Diagnosis not present

## 2020-09-20 DIAGNOSIS — M25511 Pain in right shoulder: Secondary | ICD-10-CM

## 2020-09-20 DIAGNOSIS — E785 Hyperlipidemia, unspecified: Secondary | ICD-10-CM

## 2020-09-20 DIAGNOSIS — M25512 Pain in left shoulder: Secondary | ICD-10-CM

## 2020-09-20 DIAGNOSIS — F321 Major depressive disorder, single episode, moderate: Secondary | ICD-10-CM

## 2020-09-20 DIAGNOSIS — Z0001 Encounter for general adult medical examination with abnormal findings: Secondary | ICD-10-CM

## 2020-09-20 DIAGNOSIS — G4700 Insomnia, unspecified: Secondary | ICD-10-CM

## 2020-09-20 LAB — COMPREHENSIVE METABOLIC PANEL
ALT: 12 U/L (ref 0–35)
AST: 15 U/L (ref 0–37)
Albumin: 4.2 g/dL (ref 3.5–5.2)
Alkaline Phosphatase: 70 U/L (ref 39–117)
BUN: 14 mg/dL (ref 6–23)
CO2: 30 mEq/L (ref 19–32)
Calcium: 9.5 mg/dL (ref 8.4–10.5)
Chloride: 103 mEq/L (ref 96–112)
Creatinine, Ser: 0.82 mg/dL (ref 0.40–1.20)
GFR: 76.96 mL/min (ref >60.00)
Glucose, Bld: 92 mg/dL (ref 70–99)
Potassium: 3.7 mEq/L (ref 3.5–5.1)
Sodium: 140 mEq/L (ref 135–145)
Total Bilirubin: 0.5 mg/dL (ref 0.2–1.2)
Total Protein: 7 g/dL (ref 6.0–8.3)

## 2020-09-20 LAB — LIPID PANEL
Cholesterol: 182 mg/dL (ref 0–200)
HDL: 45 mg/dL (ref >39.00)
LDL Cholesterol: 116 mg/dL — ABNORMAL HIGH (ref 0–99)
NonHDL: 136.82
Total CHOL/HDL Ratio: 4
Triglycerides: 104 mg/dL (ref 0.0–149.0)
VLDL: 20.8 mg/dL (ref 0.0–40.0)

## 2020-09-20 LAB — CBC
HCT: 38.7 % (ref 36.0–46.0)
Hemoglobin: 13.3 g/dL (ref 12.0–15.0)
MCHC: 34.5 g/dL (ref 30.0–36.0)
MCV: 88.9 fl (ref 78.0–100.0)
Platelets: 280 10*3/uL (ref 150.0–400.0)
RBC: 4.36 Mil/uL (ref 3.87–5.11)
RDW: 13.4 % (ref 11.5–15.5)
WBC: 6.2 10*3/uL (ref 4.0–10.5)

## 2020-09-20 LAB — HEMOGLOBIN A1C: Hgb A1c MFr Bld: 5.7 % (ref 4.6–6.5)

## 2020-09-20 LAB — TSH: TSH: 1.45 u[IU]/mL (ref 0.35–4.50)

## 2020-09-20 LAB — MAGNESIUM: Magnesium: 2.2 mg/dL (ref 1.5–2.5)

## 2020-09-20 MED ORDER — AZELASTINE HCL 0.1 % NA SOLN: 2.0000 | Freq: Two times a day (BID) | NASAL | 12 refills | Status: DC

## 2020-09-20 MED ORDER — LOSARTAN POTASSIUM 100 MG PO TABS: 100.0000 mg | ORAL_TABLET | Freq: Every day | ORAL | 1 refills | Status: DC

## 2020-09-20 MED ORDER — TRAZODONE HCL 50 MG PO TABS: 25.0000 mg | ORAL_TABLET | Freq: Every evening | ORAL | 3 refills | Status: DC | PRN

## 2020-09-20 MED ORDER — MELOXICAM 7.5 MG PO TABS: 7.5000 mg | ORAL_TABLET | Freq: Every day | ORAL | 0 refills | Status: DC

## 2020-09-20 MED ORDER — OMEPRAZOLE 20 MG PO CPDR: 20.0000 mg | DELAYED_RELEASE_CAPSULE | Freq: Every day | ORAL | 3 refills | Status: DC

## 2020-09-20 MED ORDER — OZEMPIC (0.25 OR 0.5 MG/DOSE) 2 MG/1.5ML ~~LOC~~ SOPN: 0.2500 mg | PEN_INJECTOR | SUBCUTANEOUS | 1 refills | Status: DC

## 2020-09-20 NOTE — Assessment & Plan Note (Signed)
Discussed treatment options.  She has been under quite a bit of stress recently.  She is also dealing with more pain withdrawal likely contributing.  We will start trazodone 25 to 50 mg nightly.  She will check with me in a couple of weeks to let me know how this is working.

## 2020-09-20 NOTE — Progress Notes (Signed)
Chief Complaint:  Karen Webster is a 60 y.o. female who presents today for her annual comprehensive physical exam.    Assessment/Plan:  New/Acute Problems: Shoulder Pain Likely rotator cuff tendinopathy.  She does have some tenderness with resisted supraspinatus testing.  May have underlying arthritis as well.  Given that symptoms have been going on for several months and close to a year will check plain films today.  Discussed home exercises and handout was given.  We'll also start meloxicam 7.5 mg daily for the next 1 to 2 weeks.  Hip Pain Likely osteoarthritis.  Should have improvement with above meloxicam.  We'll also check plain film today.  May consider referral to sports med or PT depending on results.  Eustachian Tube Dysfunction Will avoid prednisone due to side effects.  Recommended Astelin twice daily for the next several days.   Chronic Problems Addressed Today: Insomnia Discussed treatment options.  She has been under quite a bit of stress recently.  She is also dealing with more pain withdrawal likely contributing.  We will start trazodone 25 to 50 mg nightly.  She will check with me in a couple of weeks to let me know how this is working.  Morbid obesity ([place]) Unfortunately was not able to be on Ozempic for the past several months due to losing her insurance and medication being unaffordable.  Discussed alternatives however we'll go back with Ozempic due to favorable side effect profile.  We'll start with 0.25 mg weekly for the next 4 weeks and then increase dose as tolerated.  She will check with me in a few weeks.  Depression, major, single episode, moderate ([place]) Slightly worsened since her last visit.  She is not currently on any medications.  We'll treat her above problems and she'll check in with me if her depressive symptoms get worse or become more problematic.  Dyslipidemia Check labs today.  Hyperglycemia Check A1c.  Essential hypertension At goal.   Continue Dyazide 37.5-25 once daily and losartan 100 mg daily.  Check labs today.   Body mass index is 39.51 kg/m. / Obese  BMI Metric Follow Up - 09/20/20 0922      BMI Metric Follow Up-Please document annually   BMI Metric Follow Up Education provided            Preventative Healthcare: Had colonoscopy in 2018.  Due for next in 2028.  Will check labs today.  She'll be following up with GYN soon for Pap and mammogram.  Patient Counseling(The following topics were reviewed and/or handout was given):  -Nutrition: Stressed importance of moderation in sodium/caffeine intake, saturated fat and cholesterol, caloric balance, sufficient intake of fresh fruits, vegetables, and fiber.  -Stressed the importance of regular exercise.   -Substance Abuse: Discussed cessation/primary prevention of tobacco, alcohol, or other drug use; driving or other dangerous activities under the influence; availability of treatment for abuse.   -Injury prevention: Discussed safety belts, safety helmets, smoke detector, smoking near bedding or upholstery.   -Sexuality: Discussed sexually transmitted diseases, partner selection, use of condoms, avoidance of unintended pregnancy and contraceptive alternatives.   -Dental health: Discussed importance of regular tooth brushing, flossing, and dental visits.  -Health maintenance and immunizations reviewed. Please refer to Health maintenance section.  Return to care in 1 year for next preventative visit.     Subjective:  HPI:  Patient has a few issues that she would like to discuss today in addition to chronic conditions outlined above.  She has had progressively worsening  pain in bilateral shoulders.  Worse with certain motions.  Sometimes painful to touch.  No obvious injuries or precipitating events.  She is also had more pain and swelling in her right lower extremity.  Will very frequently have quite a bit of pain in right hip when standing.  She is stiff for  quite a while before eventually loosening up.  Gets better after moving around.  She takes Tylenol which helps with her pain.  She is also had some pain in her knee.  She has seen orthopedics about a year ago was told she had arthritis.  Had cortisone shot at that time which is helped.  No obvious injuries or precipitating events.  She has also had more pain in her right ear.  She has been using her Astelin nasal spray infrequently with modest improvement.  Lifestyle Diet: Cutting out salt Exercise: Limited  Depression screen [facility] 2/9 09/19/2019  Decreased Interest 2  Down, Depressed, Hopeless 1  PHQ - 2 Score 3  Altered sleeping 1  Tired, decreased energy 2  Change in appetite 2  Feeling bad or failure about yourself  1  Trouble concentrating 2  Moving slowly or fidgety/restless 0  Suicidal thoughts 0  PHQ-9 Score 11  Difficult doing work/chores Not difficult at all    Health Maintenance Due  Topic Date Due  . Hepatitis C Screening  Never done  . HIV Screening  Never done  . COLONOSCOPY (Pts 45-20yrs Insurance coverage will need to be confirmed)  Never done     ROS: Per HPI, otherwise a complete review of systems was negative.   PMH:  The following were reviewed and entered/updated in epic: Past Medical History:  Diagnosis Date  . Arthritis    knees  . GERD (gastroesophageal reflux disease)   . Hypertension   . Seasonal allergies   . SUI (stress urinary incontinence, female)    Patient Active Problem List   Diagnosis Date Noted  . Insomnia 09/20/2020  . Depression, major, single episode, moderate ([place]) 09/19/2019  . Morbid obesity ([place]) 09/19/2019  . Dyslipidemia 09/16/2018  . Essential hypertension 09/14/2018  . Gastroesophageal reflux disease without esophagitis 09/14/2018  . Menopausal symptoms 09/14/2018  . Hyperglycemia 09/14/2018  . Cervical radiculopathy secondary to DDD 08/25/2018   Past Surgical History:  Procedure Laterality Date  . CATARACT  EXTRACTION, BILATERAL    . CHOLECYSTECTOMY    . COLONOSCOPY  2018  . HYSTEROSCOPY N/A 11/17/2017   Procedure: HYSTEROSCOPY FOR REMOVAL OF IUD;  Surgeon: Princess Bruins, MD;  Location: [place];  Service: Gynecology;  Laterality: N/A;  . KNEE SURGERY     Right knee, Dr Reynaldo Minium  . NASAL SEPTUM SURGERY    . WISDOM TOOTH EXTRACTION      Family History  Problem Relation Age of Onset  . Hypertension Mother   . Hypertension Father   . Breast cancer Paternal Grandmother   . Cancer Paternal Grandmother   . Dementia Maternal Grandmother   . Heart disease Maternal Grandfather   . Heart disease Paternal Grandfather   . Colon cancer Neg Hx     Medications- reviewed and updated Current Outpatient Medications  Medication Sig Dispense Refill  . Cholecalciferol (VITAMIN D3 PO) Take 1,000 Units by mouth.    . Magnesium 200 MG TABS Take 400 mg by mouth.    . meloxicam (MOBIC) 7.5 MG tablet Take 1 tablet (7.5 mg total) by mouth daily. 30 tablet 0  . norethindrone (HEATHER) 0.35 MG tablet  Take 1 tablet (0.35 mg total) by mouth daily. 84 tablet 0  . progesterone (PROMETRIUM) 100 MG capsule Take 1 capsule (100 mg total) by mouth at bedtime. 90 capsule 0  . Semaglutide,0.25 or 0.5MG /DOS, (OZEMPIC, 0.25 OR 0.5 MG/DOSE,) 2 MG/1.5ML SOPN Inject 0.25-0.5 mg into the skin once a week. 1.5 mL 1  . traZODone (DESYREL) 50 MG tablet Take 0.5-1 tablets (25-50 mg total) by mouth at bedtime as needed for sleep. 30 tablet 3  . triamterene-hydrochlorothiazide (DYAZIDE) 37.5-25 MG capsule TAKE ONE CAPSULE BY MOUTH DAILY 90 capsule 3  . azelastine (ASTELIN) 0.1 % nasal spray Place 2 sprays into both nostrils 2 (two) times daily. Use in each nostril as directed 30 mL 12  . losartan (COZAAR) 100 MG tablet Take 1 tablet (100 mg total) by mouth daily. 90 tablet 1  . omeprazole (PRILOSEC) 20 MG capsule Take 1 capsule (20 mg total) by mouth daily. 90 capsule 3   No current facility-administered  medications for this visit.    Allergies-reviewed and updated Allergies  Allergen Reactions  . Nitrofurantoin Hives and Rash  . Ciprocin-Fluocin-Procin [Fluocinolone]   . Macrodantin [Nitrofurantoin Macrocrystal] Hives  . Tetracycline Hives  . Tetracyclines & Related Hives  . Bactrim [Sulfamethoxazole-Trimethoprim] Rash    Social History   Socioeconomic History  . Marital status: Widowed    Spouse name: Not on file  . Number of children: Not on file  . Years of education: Not on file  . Highest education level: Not on file  Occupational History  . Not on file  Tobacco Use  . Smoking status: Never Smoker  . Smokeless tobacco: Never Used  Vaping Use  . Vaping Use: Never used  Substance and Sexual Activity  . Alcohol use: Yes    Comment: SOCIAL   . Drug use: Never  . Sexual activity: Yes    Partners: Male    Birth control/protection: None, Pill    Comment: 1st intercourse- 36, partners-6, current partner- 75 ys   Other Topics Concern  . Not on file  Social History Narrative  . Not on file   Social Determinants of Health   Financial Resource Strain: Not on file  Food Insecurity: Not on file  Transportation Needs: Not on file  Physical Activity: Not on file  Stress: Not on file  Social Connections: Not on file        Objective:  Physical Exam: BP 119/79   Pulse 76   Temp 98.2 F (36.8 C) (Temporal)   Ht 5\' 6"  (1.676 m)   Wt 244 lb 12.8 oz (111 kg)   SpO2 95%   BMI 39.51 kg/m   Body mass index is 39.51 kg/m. Wt Readings from Last 3 Encounters:  09/20/20 244 lb 12.8 oz (111 kg)  09/19/19 245 lb 4 oz (111.2 kg)  02/16/19 239 lb (108.4 kg)   Gen: NAD, resting comfortably HEENT: TMs with effusion bilaterally. No erythema.  CV: RRR with no murmurs appreciated Pulm: NWOB, CTAB with no crackles, wheezes, or rhonchi GI: Normal bowel sounds present. Soft, Nontender, Nondistended. MSK: -Neck: No deformities -Shoulders: Tenderness palpation along superior  and posterior shoulders bilaterally.  Pain elicited with palpation along supraspinatus area.  Also pain with resisted supraspinatus testing.  Normal internal and external rotation bilaterally.  Neurovascular intact distally -Hips: No deformities.  Limited external range of motion bilaterally.  Internal range of motion elicits pain in bilateral hips -Knees: No gross deformities.  Crepitus with active and passive range of motion.  No joint line tenderness.  Mild effusion noted. Skin: warm, dry Neuro: CN2-12 grossly intact. Strength 5/5 in upper and lower extremities. Reflexes symmetric and intact bilaterally.  Psych: Normal affect and thought content     Caleb M. [name], MD 09/20/2020 9:25 AM

## 2020-09-20 NOTE — Assessment & Plan Note (Signed)
Check labs today.

## 2020-09-20 NOTE — Assessment & Plan Note (Signed)
At goal.  Continue Dyazide 37.5-25 once daily and losartan 100 mg daily.  Check labs today.

## 2020-09-20 NOTE — Assessment & Plan Note (Signed)
Slightly worsened since her last visit.  She is not currently on any medications.  We'll treat her above problems and she'll check in with me if her depressive symptoms get worse or become more problematic.

## 2020-09-20 NOTE — Progress Notes (Signed)
Please inform patient of the following:  Cholestorol and blood sugar better than last year. Everything else is NORMAL. Do not need to make any changes to her treatment plan at this time and we can recheck in a year.  [name]. [name], MD 09/20/2020 2:05 PM

## 2020-09-20 NOTE — Assessment & Plan Note (Signed)
Unfortunately was not able to be on Ozempic for the past several months due to losing her insurance and medication being unaffordable.  Discussed alternatives however we'll go back with Ozempic due to favorable side effect profile.  We'll start with 0.25 mg weekly for the next 4 weeks and then increase dose as tolerated.  She will check with me in a few weeks.

## 2020-09-20 NOTE — Patient Instructions (Signed)
It was very nice to see you today!  You have some inflammation in your rotator cuff.  Please start meloxicam for the next week or 2 and work on the exercises.  We'll check x-rays of your shoulders and hip today.  Please start the [place].  Take 0.25 mg weekly for about 4 weeks and then increase to 0.5 mg weekly.  Please try trazodone to help with your sleep.  Please send a message in a few weeks to let me know how you're doing and we can adjust your dose of medications as needed.  I will see back in 1 year for your next annual physical.  Please come back to see me sooner if needed.  Take care, Dr [name]  Please try these tips to maintain a healthy lifestyle:   Eat at least 3 REAL meals and 1-2 snacks per day.  Aim for no more than 5 hours between eating.  If you eat breakfast, please do so within one hour of getting up.    Each meal should contain half fruits/vegetables, one quarter protein, and one quarter carbs (no bigger than a computer mouse)   Cut down on sweet beverages. This includes juice, soda, and sweet tea.     Drink at least 1 glass of water with each meal and aim for at least 8 glasses per day   Exercise at least 150 minutes every week.    Preventive Care 29-60 Years Old, Female Preventive care refers to lifestyle choices and visits with your health care provider that can promote health and wellness. This includes:  A yearly physical exam. This is also called an annual wellness visit.  Regular dental and eye exams.  Immunizations.  Screening for certain conditions.  Healthy lifestyle choices, such as: ? Eating a healthy diet. ? Getting regular exercise. ? Not using drugs or products that contain nicotine and tobacco. ? Limiting alcohol use. What can I expect for my preventive care visit? Physical exam Your health care provider will check your:  Height and weight. These may be used to calculate your BMI (body mass index). BMI is a measurement that  tells if you are at a healthy weight.  Heart rate and blood pressure.  Body temperature.  Skin for abnormal spots. Counseling Your health care provider may ask you questions about your:  Past medical problems.  Family's medical history.  Alcohol, tobacco, and drug use.  Emotional well-being.  Home life and relationship well-being.  Sexual activity.  Diet, exercise, and sleep habits.  Work and work Statistician.  Access to firearms.  Method of birth control.  Menstrual cycle.  Pregnancy history. What immunizations do I need? Vaccines are usually given at various ages, according to a schedule. Your health care provider will recommend vaccines for you based on your age, medical history, and lifestyle or other factors, such as travel or where you work.   What tests do I need? Blood tests  Lipid and cholesterol levels. These may be checked every 5 years, or more often if you are over 39 years old.  Hepatitis C test.  Hepatitis B test. Screening  Lung cancer screening. You may have this screening every year starting at age 70 if you have a 30-pack-year history of smoking and currently smoke or have quit within the past 15 years.  Colorectal cancer screening. ? All adults should have this screening starting at age 69 and continuing until age 26. ? Your health care provider may recommend screening at age 64 if  you are at increased risk. ? You will have tests every 1-10 years, depending on your results and the type of screening test.  Diabetes screening. ? This is done by checking your blood sugar (glucose) after you have not eaten for a while (fasting). ? You may have this done every 1-3 years.  Mammogram. ? This may be done every 1-2 years. ? Talk with your health care provider about when you should start having regular mammograms. This may depend on whether you have a family history of breast cancer.  BRCA-related cancer screening. This may be done if you have a  family history of breast, ovarian, tubal, or peritoneal cancers.  Pelvic exam and Pap test. ? This may be done every 3 years starting at age 44. ? Starting at age 55, this may be done every 5 years if you have a Pap test in combination with an HPV test. Other tests  STD (sexually transmitted disease) testing, if you are at risk.  Bone density scan. This is done to screen for osteoporosis. You may have this scan if you are at high risk for osteoporosis. Talk with your health care provider about your test results, treatment options, and if necessary, the need for more tests. Follow these instructions at home: Eating and drinking  Eat a diet that includes fresh fruits and vegetables, whole grains, lean protein, and low-fat dairy products.  Take vitamin and mineral supplements as recommended by your health care provider.  Do not drink alcohol if: ? Your health care provider tells you not to drink. ? You are pregnant, may be pregnant, or are planning to become pregnant.  If you drink alcohol: ? Limit how much you have to 0-1 drink a day. ? Be aware of how much alcohol is in your drink. In the [place] one drink equals one 12 oz bottle of beer (355 mL), one 5 oz glass of wine (148 mL), or one 1 oz glass of hard liquor (44 mL).   Lifestyle  Take daily care of your teeth and gums. Brush your teeth every morning and night with fluoride toothpaste. Floss one time each day.  Stay active. Exercise for at least 30 minutes 5 or more days each week.  Do not use any products that contain nicotine or tobacco, such as cigarettes, e-cigarettes, and chewing tobacco. If you need help quitting, ask your health care provider.  Do not use drugs.  If you are sexually active, practice safe sex. Use a condom or other form of protection to prevent STIs (sexually transmitted infections).  If you do not wish to become pregnant, use a form of birth control. If you plan to become pregnant, see your health care  provider for a prepregnancy visit.  If told by your health care provider, take low-dose aspirin daily starting at age 61.  Find healthy ways to cope with stress, such as: ? Meditation, yoga, or listening to music. ? Journaling. ? Talking to a trusted person. ? Spending time with friends and family. Safety  Always wear your seat belt while driving or riding in a vehicle.  Do not drive: ? If you have been drinking alcohol. Do not ride with someone who has been drinking. ? When you are tired or distracted. ? While texting.  Wear a helmet and other protective equipment during sports activities.  If you have firearms in your house, make sure you follow all gun safety procedures. What's next?  Visit your health care provider once a year for  an annual wellness visit.  Ask your health care provider how often you should have your eyes and teeth checked.  Stay up to date on all vaccines. This information is not intended to replace advice given to you by your health care provider. Make sure you discuss any questions you have with your health care provider. Document Revised: 04/23/2020 Document Reviewed: 03/31/2018 Elsevier Patient Education  2021 [employer].

## 2020-09-20 NOTE — Assessment & Plan Note (Signed)
Check A1c. 

## 2020-09-23 ENCOUNTER — Telehealth: Payer: Self-pay | Admitting: *Deleted

## 2020-09-23 NOTE — Progress Notes (Signed)
Please inform patient of the following:  Her xrays are all NORMAL. I think her shoulder pain is most likely soft tissue like we discussed. Would like for her to let us know if her symptoms are not improving.  [name]. [name], MD 09/23/2020 8:08 AM

## 2020-09-23 NOTE — Telephone Encounter (Signed)
PA  Send today Ozempic (0.25 or 0.5 MG/DOSE) 2MG /1.5ML pen-injectors   Form [employer] Electronic Prior Authorization Form (2017 NCPDP)  waiting for determination

## 2020-09-24 NOTE — Telephone Encounter (Signed)
Patient states insurance denied the Ozempic  And patient would like to know why and if we can resubmit it or send in something similar. Please advise

## 2020-09-25 NOTE — Telephone Encounter (Signed)
Please advise 

## 2020-09-25 NOTE — Telephone Encounter (Signed)
Are there any formulary alternatives?  [name]. [name], MD 09/25/2020 11:56 AM

## 2020-09-25 NOTE — Telephone Encounter (Signed)
Per Insurance have to tried  Metformin  Glipizide  Glyburide Pioglitazone-Metformin

## 2020-09-26 ENCOUNTER — Other Ambulatory Visit: Payer: Self-pay | Admitting: *Deleted

## 2020-09-26 MED ORDER — METFORMIN HCL 500 MG PO TABS
500.0000 mg | ORAL_TABLET | Freq: Every day | ORAL | 3 refills | Status: DC
Start: 2020-09-26 — End: 2021-03-18

## 2020-09-26 NOTE — Telephone Encounter (Signed)
We can try metformin 500mg  if patient is agreeable.  [name]. [name], MD 09/26/2020 10:33 AM

## 2020-09-26 NOTE — Telephone Encounter (Signed)
Pt agreed with medication changes  Metformin send to pharmacy

## 2020-10-13 ENCOUNTER — Other Ambulatory Visit: Payer: Self-pay | Admitting: Family Medicine

## 2020-10-14 ENCOUNTER — Other Ambulatory Visit: Payer: Self-pay | Admitting: Family Medicine

## 2020-10-14 LAB — HM MAMMOGRAPHY

## 2020-10-18 ENCOUNTER — Encounter: Payer: Self-pay | Admitting: Family Medicine

## 2020-11-08 ENCOUNTER — Other Ambulatory Visit: Payer: Self-pay

## 2020-11-08 ENCOUNTER — Other Ambulatory Visit (HOSPITAL_COMMUNITY)
Admission: RE | Admit: 2020-11-08 | Discharge: 2020-11-08 | Disposition: A | Discharge status: Home / Self Care | Payer: 59 | Source: Ambulatory Visit | Attending: Obstetrics & Gynecology | Admitting: Obstetrics & Gynecology

## 2020-11-08 ENCOUNTER — Ambulatory Visit: Payer: 59 | Admitting: Obstetrics & Gynecology

## 2020-11-08 ENCOUNTER — Encounter: Payer: Self-pay | Admitting: Obstetrics & Gynecology

## 2020-11-08 VITALS — BP 120/72 | Ht 65.5 in | Wt 245.0 lb

## 2020-11-08 DIAGNOSIS — R350 Frequency of micturition: Secondary | ICD-10-CM

## 2020-11-08 DIAGNOSIS — Z01419 Encounter for gynecological examination (general) (routine) without abnormal findings: Secondary | ICD-10-CM

## 2020-11-08 DIAGNOSIS — N951 Menopausal and female climacteric states: Secondary | ICD-10-CM | POA: Diagnosis not present

## 2020-11-08 DIAGNOSIS — Z6841 Body Mass Index (BMI) 40.0 and over, adult: Secondary | ICD-10-CM

## 2020-11-08 DIAGNOSIS — E66813 Obesity, class 3: Secondary | ICD-10-CM

## 2020-11-08 LAB — URINALYSIS, COMPLETE W/RFL CULTURE
Bacteria, UA: NONE SEEN /HPF
Bilirubin Urine: NEGATIVE
Glucose, UA: NEGATIVE
Hgb urine dipstick: NEGATIVE
Hyaline Cast: NONE SEEN /LPF
Ketones, ur: NEGATIVE
Leukocyte Esterase: NEGATIVE
Nitrites, Initial: NEGATIVE
Protein, ur: NEGATIVE
RBC / HPF: NONE SEEN /HPF (ref 0–2)
Specific Gravity, Urine: 1.02 (ref 1.001–1.03)
WBC, UA: NONE SEEN /HPF (ref 0–5)
pH: 6 (ref 5.0–8.0)

## 2020-11-08 LAB — NO CULTURE INDICATED

## 2020-11-08 NOTE — Progress Notes (Signed)
Karen Webster 1961-03-06 [id number]   History:    60 y.o. G1P1L1 Married  RP:  Established patient presenting for annual gyn exam   HPI: Probably menopausal with no menses on the progestin only birth control pill and Prometrium 100 mg at bedtime. Hot flushes and night sweats present.  No pelvic pain.  Dryness with IC.  Known uterine fibroids.  Last pelvic ultrasound April 2019. Urine and bowel movements normal.  Breast normal.  Body mass index 40.15.  [place] the beach[place]  [place] the beach since [place]. Health labs with family physician.   Past medical history,surgical history, family history and social history were all reviewed and documented in the EPIC chart.  Gynecologic History No LMP recorded. (Menstrual status: Perimenopausal).  Obstetric History OB History  Gravida Para Term Preterm AB Living  1 1       1   SAB IAB Ectopic Multiple Live Births               # Outcome Date GA Lbr Len/2nd Weight Sex Delivery Anes PTL Lv  1 Para              ROS: A ROS was performed and pertinent positives and negatives are included in the history.  GENERAL: No fevers or chills. HEENT: No change in vision, no earache, sore throat or sinus congestion. NECK: No pain or stiffness. CARDIOVASCULAR: No chest pain or pressure. No palpitations. PULMONARY: No shortness of breath, cough or wheeze. GASTROINTESTINAL: No abdominal pain, nausea, vomiting or diarrhea, melena or bright red blood per rectum. GENITOURINARY: No urinary frequency, urgency, hesitancy or dysuria. MUSCULOSKELETAL: No joint or muscle pain, no back pain, no recent trauma. DERMATOLOGIC: No rash, no itching, no lesions. ENDOCRINE: No polyuria, polydipsia, no heat or cold intolerance. No recent change in weight. HEMATOLOGICAL: No anemia or easy bruising or bleeding. NEUROLOGIC: No headache, seizures, numbness, tingling or weakness. PSYCHIATRIC: No depression, no loss of interest in normal activity or change in  sleep pattern.     Exam:   BP 120/72   Ht 5' 5.5" (1.664 m)   Wt 245 lb (111.1 kg)   BMI 40.15 kg/m   Body mass index is 40.15 kg/m.  General appearance : Well developed well nourished female. No acute distress HEENT: Eyes: no retinal hemorrhage or exudates,  Neck supple, trachea midline, no carotid bruits, no thyroidmegaly Lungs: Clear to auscultation, no rhonchi or wheezes, or rib retractions  Heart: Regular rate and rhythm, no murmurs or gallops Breast:Examined in sitting and supine position were symmetrical in appearance, no palpable masses or tenderness,  no skin retraction, no nipple inversion, no nipple discharge, no skin discoloration, no axillary or supraclavicular lymphadenopathy Abdomen: no palpable masses or tenderness, no rebound or guarding Extremities: no edema or skin discoloration or tenderness  Pelvic: Vulva: Normal             Vagina: No gross lesions or discharge  Cervix: No gross lesions or discharge.  Pap reflex done.  Uterus  AV, normal size, shape and consistency, non-tender and mobile  Adnexa  Without masses or tenderness  Anus: Normal  U/A Negative   Assessment/Plan:  60 y.o. female for annual exam   1. Encounter for routine gynecological examination with Papanicolaou smear of cervix Normal gynecologic exam in probable menopause.  Pap reflex done today.  Breasts normal.  Screening mammo 10/2020.  [place] 2019.  Health labs Fam MD. - Cytology - PAP( [place])  2. Menopause  syndrome If Menopause is confirmed, will start on Estradiol patch 0.05 twice weekly and Progesterone 100 mg HS.  No CI to HRT.  Usage/risks/benefits reviewed.  - FSH - TSH  3. Frequency of urination U/A Negative, reassured. - Urinalysis,Complete w/RFL Culture  4. Class 3 severe obesity due to excess calories without serious comorbidity with body mass index (BMI) of 40.0 to 44.9 in adult [facility]) Low calorie/carb diet.  Aerobic activities 5 times a week, light weight lifting  every 2 days. - TSH  Princess Bruins MD, 4:24 PM 11/08/2020

## 2020-11-10 ENCOUNTER — Encounter: Payer: Self-pay | Admitting: Obstetrics & Gynecology

## 2020-11-10 ENCOUNTER — Other Ambulatory Visit: Payer: Self-pay | Admitting: Family Medicine

## 2020-11-12 LAB — CYTOLOGY - PAP: Diagnosis: NEGATIVE

## 2020-11-14 ENCOUNTER — Other Ambulatory Visit: Payer: Self-pay

## 2020-11-14 ENCOUNTER — Other Ambulatory Visit: Payer: 59

## 2020-11-14 DIAGNOSIS — N951 Menopausal and female climacteric states: Secondary | ICD-10-CM

## 2020-11-15 LAB — TSH: TSH: 1.7 mIU/L (ref 0.40–4.50)

## 2020-11-15 LAB — FOLLICLE STIMULATING HORMONE: FSH: 53.8 m[IU]/mL

## 2020-11-18 ENCOUNTER — Encounter: Payer: Self-pay | Admitting: Family Medicine

## 2020-11-18 ENCOUNTER — Other Ambulatory Visit: Payer: Self-pay | Admitting: Family Medicine

## 2020-11-18 MED ORDER — AMOXICILLIN 875 MG PO TABS
875.0000 mg | ORAL_TABLET | Freq: Two times a day (BID) | ORAL | 0 refills | Status: DC
Start: 2020-11-18 — End: 2021-03-14

## 2020-11-18 NOTE — Telephone Encounter (Signed)
Pt called in following up on this message

## 2020-11-18 NOTE — Telephone Encounter (Signed)
Pt called into schedule and told me that she was told by Dr. [name] that if the nasal spray wasn't working to just call and he would send in something else for her. Pt is unsure why she has to come in for a follow up when Dr. [name] told her that. Please advise

## 2020-11-18 NOTE — Telephone Encounter (Signed)
Pt is following up on this 

## 2020-11-18 NOTE — Telephone Encounter (Signed)
Pt called following up on this.

## 2020-11-20 ENCOUNTER — Other Ambulatory Visit: Payer: Self-pay

## 2020-11-20 DIAGNOSIS — N951 Menopausal and female climacteric states: Secondary | ICD-10-CM

## 2020-11-20 DIAGNOSIS — H9210 Otorrhea, unspecified ear: Secondary | ICD-10-CM

## 2020-11-20 MED ORDER — PROGESTERONE MICRONIZED 100 MG PO CAPS: 100.0000 mg | ORAL_CAPSULE | Freq: Every day | ORAL | 2 refills | Status: DC

## 2020-11-20 MED ORDER — AZITHROMYCIN 250 MG PO TABS: ORAL_TABLET | ORAL | 0 refills | Status: DC

## 2020-11-20 MED ORDER — ESTRADIOL 0.05 MG/24HR TD PTTW: 1.0000 | MEDICATED_PATCH | TRANSDERMAL | 2 refills | Status: DC

## 2020-12-05 ENCOUNTER — Other Ambulatory Visit: Payer: Self-pay | Admitting: Obstetrics & Gynecology

## 2020-12-09 ENCOUNTER — Other Ambulatory Visit: Payer: Self-pay | Admitting: Family Medicine

## 2020-12-11 ENCOUNTER — Other Ambulatory Visit: Payer: Self-pay | Admitting: Obstetrics & Gynecology

## 2020-12-11 ENCOUNTER — Other Ambulatory Visit: Payer: Self-pay | Admitting: Family Medicine

## 2020-12-12 ENCOUNTER — Other Ambulatory Visit: Payer: Self-pay | Admitting: Obstetrics and Gynecology

## 2020-12-12 ENCOUNTER — Other Ambulatory Visit: Payer: Self-pay | Admitting: Family Medicine

## 2021-01-09 ENCOUNTER — Other Ambulatory Visit: Payer: Self-pay | Admitting: Obstetrics and Gynecology

## 2021-01-13 ENCOUNTER — Other Ambulatory Visit: Payer: Self-pay | Admitting: Family Medicine

## 2021-01-30 ENCOUNTER — Other Ambulatory Visit: Payer: Self-pay | Admitting: Obstetrics and Gynecology

## 2021-01-30 DIAGNOSIS — N951 Menopausal and female climacteric states: Secondary | ICD-10-CM

## 2021-02-18 ENCOUNTER — Other Ambulatory Visit: Payer: Self-pay | Admitting: Family Medicine

## 2021-03-10 ENCOUNTER — Encounter: Payer: Self-pay | Admitting: Family Medicine

## 2021-03-14 ENCOUNTER — Telehealth: Payer: Self-pay

## 2021-03-14 ENCOUNTER — Other Ambulatory Visit: Payer: Self-pay

## 2021-03-14 ENCOUNTER — Ambulatory Visit: Payer: 59 | Admitting: Family Medicine

## 2021-03-14 ENCOUNTER — Encounter: Payer: Self-pay | Admitting: Family Medicine

## 2021-03-14 VITALS — BP 105/70 | HR 81 | Temp 98.7°F | Ht 65.5 in | Wt 243.8 lb

## 2021-03-14 DIAGNOSIS — H6983 Other specified disorders of Eustachian tube, bilateral: Secondary | ICD-10-CM | POA: Diagnosis not present

## 2021-03-14 DIAGNOSIS — G44209 Tension-type headache, unspecified, not intractable: Secondary | ICD-10-CM

## 2021-03-14 DIAGNOSIS — M542 Cervicalgia: Secondary | ICD-10-CM | POA: Diagnosis not present

## 2021-03-14 DIAGNOSIS — J3089 Other allergic rhinitis: Secondary | ICD-10-CM

## 2021-03-14 DIAGNOSIS — M5030 Other cervical disc degeneration, unspecified cervical region: Secondary | ICD-10-CM

## 2021-03-14 DIAGNOSIS — F430 Acute stress reaction: Secondary | ICD-10-CM

## 2021-03-14 MED ORDER — FLUTICASONE PROPIONATE 50 MCG/ACT NA SUSP: 1.0000 | Freq: Every day | NASAL | 6 refills | Status: DC

## 2021-03-14 NOTE — Progress Notes (Signed)
Subjective  CC:  Chief Complaint  Patient presents with   Ear Pain    Rt ear   Headache   Same day acute visit; PCP not available. New pt to me. Chart reviewed.   HPI: Karen Webster is a 59 y.o. female who presents to the office today to address the problems listed above in the chief complaint. 60 year old female  complains of persistent problems with her right ear.  She has right ear fullness.  Started back in April.  Has been treated with Astelin nasal spray.  She uses this as needed.  Symptoms will come and go.  She also complains of right-sided neck pain, worse with movement, tingling bandlike headaches on and off for the last week or so, some postnasal drainage, sneezing. Right ear pain: No fevers, chills, sore throat but does have sinus pressure.  Has long history of recurrent sinus infection.  Has had sinus surgery in the past.  Not using anything to treat her chronic allergies.  She has had formal allergy testing in the past.  Humidity is a trigger. Reviewed chart, notes from 2020 show that she was seeing an orthopedic for cervical DDD and radiculopathy.  Patient seems unclear on this diagnosis.  No radicular symptoms now but having pain in the right side of her neck mainly with turning her head.  She feels like it could be coming from her ear.  No right upper extremity weakness.  Tylenol arthritis helps neck pain and headaches. Complains of tension bandlike headache and tingling in the back of her head for the last 3 to 4 days.  She is worried about her cerebral aneurysm.  No neurologic deficits.  No severe headaches. Stress: Endorses high stress.  Poor sleep, tearful often, feeling down.  No history of mood problems.  Assessment  1. Seasonal allergic rhinitis due to other allergic trigger   2. ETD (Eustachian tube dysfunction), bilateral   3. Neck pain on right side   4. Tension headache   5. Stress reaction      Plan  Ear pain: Allergies and eustachian tube  dysfunction: Start Flonase daily and Zyrtec.  Education given.  Avoid prednisone due to side effects. 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  Musculoskeletal.  Heat, massage, range of motion exercises and Tylenol arthritis.  Follow-up if worsens Tension headaches: Likely related to stress: Counseling done.  Recommend follow-up with PCP to further address stress reaction, management and rule out mood disorder.  Patient understands and agrees with care plan.  Discussed behavioral management of stressors.  See after visit summary.  Follow up: With PCP 03/18/2021  No orders of the defined types were placed in this encounter.  Meds ordered this encounter  Medications   fluticasone (FLONASE) 50 MCG/ACT nasal spray    Sig: Place 1 spray into both nostrils daily.    Dispense:  16 g    Refill:  6      I reviewed the patients updated PMH, FH, and SocHx.    Patient Active Problem List   Diagnosis Date Noted   Insomnia 09/20/2020   Depression, major, single episode, moderate ([place]) 09/19/2019   Morbid obesity ([place]) 09/19/2019   Dyslipidemia 09/16/2018   Essential hypertension 09/14/2018   Gastroesophageal reflux disease without esophagitis 09/14/2018   Menopausal symptoms 09/14/2018   Hyperglycemia 09/14/2018   Cervical radiculopathy secondary to DDD 08/25/2018   Current Meds  Medication Sig   fluticasone (FLONASE) 50 MCG/ACT nasal spray Place 1 spray into both nostrils  daily.    Allergies: Patient is allergic to nitrofurantoin, ciprocin-fluocin-procin [fluocinolone], macrodantin [nitrofurantoin macrocrystal], tetracycline, tetracyclines & related, and bactrim [sulfamethoxazole-trimethoprim]. Family History: Patient family history includes Breast cancer in her paternal grandmother; Cancer in her paternal grandmother; Dementia in her maternal grandmother; Heart disease in her maternal grandfather and paternal grandfather; Hypertension in her father and mother. Social History:   Patient  reports that she has never smoked. She has never used smokeless tobacco. She reports current alcohol use. She reports that she does not use drugs.  Review of Systems: Constitutional: Negative for fever malaise or anorexia Cardiovascular: negative for chest pain Respiratory: negative for SOB or persistent cough Gastrointestinal: negative for abdominal pain  Objective  Vitals: BP 105/70   Pulse 81   Temp 98.7 F (37.1 C) (Temporal)   Ht 5' 5.5" (1.664 m)   Wt 243 lb 12.8 oz (110.6 kg)   SpO2 96%   BMI 39.95 kg/m  General: no acute distress , A&Ox3 Psych: Tearful during the interview. HEENT: PEERL, conjunctiva normal, neck is supple, TMs normal-appearing, minimal fluid noted on inner right ear.  No erythema.  No bulging.  Normal landmarks.  No cervical lymphadenopathy Neck: Right tender strap muscles and pain with right rotation.  No spinal tenderness. Cardiovascular:  RRR without murmur or gallop.  Respiratory:  Good breath sounds bilaterally, CTAB with normal respiratory effort Skin:  Warm, no rashes    Commons side effects, risks, benefits, and alternatives for medications and treatment plan prescribed today were discussed, and the patient expressed understanding of the given instructions. Patient is instructed to call or message via MyChart if he/she has any questions or concerns regarding our treatment plan. No barriers to understanding were identified. We discussed Red Flag symptoms and signs in detail. Patient expressed understanding regarding what to do in case of urgent or emergency type symptoms.  Medication list was reconciled, printed and provided to the patient in AVS. Patient instructions and summary information was reviewed with the patient as documented in the AVS. This note was prepared with assistance of Dragon voice recognition software. Occasional wrong-word or sound-a-like substitutions may have occurred due to the inherent limitations of voice recognition  software  This visit occurred during the SARS-CoV-2 public health emergency.  Safety protocols were in place, including screening questions prior to the visit, additional usage of staff PPE, and extensive cleaning of exam room while observing appropriate contact time as indicated for disinfecting solutions.

## 2021-03-14 NOTE — Telephone Encounter (Signed)
Patient was seen in office today.  Nurse Assessment Nurse: Raphael Gibney, RN, Vanita Ingles Date/Time (Eastern Time): 03/14/2021 9:03:15 AM Confirm and document reason for call. If symptomatic, describe symptoms. ---Caller states she has right ear pain. feels like it is full of fluid. head is tingling. Feels like she has a band around her head. no fever. Does the patient have any new or worsening symptoms? ---Yes Will a triage be completed? ---Yes Related visit to physician within the last 2 weeks? ---No Does the PT have any chronic conditions? (i.e. diabetes, asthma, this includes High risk factors for pregnancy, etc.) ---Yes List chronic conditions. ---HTN Is this a behavioral health or substance abuse call? ---No Guidelines Guideline Title Affirmed Question Affirmed Notes Nurse Date/Time (Eastern Time) Headache [1] New headache AND [2] age > 81 Raphael Gibney, RN, Vanita Ingles 03/14/2021 9:08:18 AM Earache Earache (Exceptions: brief ear pain of < 60 minutes duration, earache occurring during air travel [place], [place], Vanita Ingles 03/14/2021 9:13:14 AM PLEASE NOTE: All timestamps contained within this report are represented as [place] Standard Time. CONFIDENTIALTY NOTICE: This fax transmission is intended only for the addressee. It contains information that is legally privileged, confidential or otherwise protected from use or disclosure. If you are not the intended recipient, you are strictly prohibited from reviewing, disclosing, copying using or disseminating any of this information or taking any action in reliance on or regarding this information. If you have received this fax in error, please notify us immediately by telephone so that we can arrange for its return to [name]. Phone: [phone], Toll-Free: [phone], Fax: [phone] Page: 2 of 2 Call Id: [id number] [place]. Time Eilene Ghazi Time) Disposition Final User 03/14/2021 9:12:45 AM See PCP within 24 Hours Stringer, RN, Vanita Ingles 03/14/2021 9:14:35 AM See PCP  within 24 Hours Yes Raphael Gibney, RN, Doreatha Lew Disagree/Comply Comply Caller Understands Yes PreDisposition Call Doctor Care Advice Given Per Guideline SEE PCP WITHIN 24 HOURS: * IF OFFICE WILL BE OPEN: You need to be examined within the next 24 hours. Call your doctor (or NP/PA) when the office opens and make an appointment. CALL BACK IF: * Blurred vision or double vision occurs * You become worse * Difficulty with speaking * Numbness or weakness of the face, arm or leg CARE ADVICE given per Headache (Adult) guideline. SEE PCP WITHIN 24 HOURS: * IF OFFICE WILL BE OPEN: You need to be examined within the next 24 hours. Call your doctor (or NP/PA) when the office opens and make an appointment. CALL BACK IF: * You become worse CARE ADVICE given per Earache (Adult) guideline. Comments User: Dannielle Burn, RN Date/Time Eilene Ghazi Time): 03/14/2021 9:14:24 AM warm transferred to the office for appt Referrals [place] UNDECIDED REFERRED TO PCP OFFICE

## 2021-03-14 NOTE — Patient Instructions (Signed)
Please return for follow up with Dr. [name] to discuss stress management.   Start otc Zyrtec once nightly along with daily flonase for the next 8-12 weeks.  Continue tylenol arthritis for neck and headaches as needed.  If you have any questions or concerns, please don't hesitate to send me a message via MyChart or call the office at [phone]. Thank you for visiting with [name] today! It's our pleasure caring for you.   Textbook of family medicine (9th ed., pp. 1601-0932). [place], [place]: [employer].">  Stress, Adult Stress is a normal reaction to life events. Stress is what you feel when life demands more than you are used to, or more than you think you can handle. Some stress can be useful, such as studying for a test or meeting a deadline at work. Stress that occurs too often or for too long can cause problems. It can affect your emotional health and interfere with relationships and normal daily activities. Too much stress can weaken your body's defense system (immune system) and increase your risk for physical illness. If you already have a medicalproblem, stress can make it worse. What are the causes? All sorts of life events can cause stress. An event that causes stress for one person may not be stressful for another person. Major life events, whether positive or negative, commonly cause stress. Examples include: Losing a job or starting a new job. Losing a loved one. Moving to a new town or home. Getting married or divorced. Having a baby. Getting injured or sick. Less obvious life events can also cause stress, especially if they occur day after day or in combination with each other. Examples include: Working long hours. Driving in traffic. Caring for children. Being in debt. Being in a difficult relationship. What are the signs or symptoms? Stress can cause emotional symptoms, including: Anxiety. This is feeling worried, afraid, on edge, overwhelmed, or out of control. Anger,  including irritation or impatience. Depression. This is feeling sad, down, helpless, or guilty. Trouble focusing, remembering, or making decisions. Stress can cause physical symptoms, including: Aches and pains. These may affect your head, neck, back, stomach, or other areas of your body. Tight muscles or a clenched jaw. Low energy. Trouble sleeping. Stress can cause unhealthy behaviors, including: Eating to feel better (overeating) or skipping meals. Working too much or putting off tasks. Smoking, drinking alcohol, or using drugs to feel better. How is this diagnosed? Stress is diagnosed through an assessment by your health care provider. He or she may diagnose this condition based on: Your symptoms and any stressful life events. Your medical history. Tests to rule out other causes of your symptoms. Depending on your condition, your health care provider may refer you to aspecialist for further evaluation. How is this treated?  Stress management techniques are the recommended treatment for stress. Medicineis not typically recommended for the treatment of stress. Techniques to reduce your reaction to stressful life events include: Stress identification. Monitor yourself for symptoms of stress and identify what causes stress for you. These skills may help you to avoid or prepare for stressful events. Time management. Set your priorities, keep a calendar of events, and learn to say no. Taking these actions can help you avoid making too many commitments. Techniques for coping with stress include: Rethinking the problem. Try to think realistically about stressful events rather than ignoring them or overreacting. Try to find the positives in a stressful situation rather than focusing on the negatives. Exercise. Physical exercise can release both physical  and emotional tension. The key is to find a form of exercise that you enjoy and do it regularly. Relaxation techniques. These relax the body  and mind. The key is to find one or more that you enjoy and use the techniques regularly. Examples include: Meditation, deep breathing, or progressive relaxation techniques. Yoga or tai chi. Biofeedback, mindfulness techniques, or journaling. Listening to music, being out in nature, or participating in other hobbies. Practicing a healthy lifestyle. Eat a balanced diet, drink plenty of water, limit or avoid caffeine, and get plenty of sleep. Having a strong support network. Spend time with family, friends, or other people you enjoy being around. Express your feelings and talk things over with someone you trust. Counseling or talk therapy with a mental health professional may be helpful if you are havingtrouble managing stress on your own. Follow these instructions at home: Lifestyle  Avoid drugs. Do not use any products that contain nicotine or tobacco, such as cigarettes, e-cigarettes, and chewing tobacco. If you need help quitting, ask your health care provider. Limit alcohol intake to no more than 1 drink a day for nonpregnant women and 2 drinks a day for men. One drink equals 12 oz of beer, 5 oz of wine, or 1 oz of hard liquor Do not use alcohol or drugs to relax. Eat a balanced diet that includes fresh fruits and vegetables, whole grains, lean meats, fish, eggs, and beans, and low-fat dairy. Avoid processed foods and foods high in added fat, sugar, and salt. Exercise at least 30 minutes on 5 or more days each week. Get 7-8 hours of sleep each night.  General instructions  Practice stress management techniques as discussed with your health care provider. Drink enough fluid to keep your urine clear or pale yellow. Take over-the-counter and prescription medicines only as told by your health care provider. Keep all follow-up visits as told by your health care provider. This is important.  Contact a health care provider if: Your symptoms get worse. You have new symptoms. You feel  overwhelmed by your problems and can no longer manage them on your own. Get help right away if: You have thoughts of hurting yourself or others. If you ever feel like you may hurt yourself or others, or have thoughts about taking your own life, get help right away. You can go to your nearest emergency department or call: Your local emergency services (911 in the [place] A suicide crisis helpline, such as the [place] at 479-120-6885. This is open 24 hours a day. Summary Stress is a normal reaction to life events. It can cause problems if it happens too often or for too long. Practicing stress management techniques is the best way to treat stress. Counseling or talk therapy with a mental health professional may be helpful if you are having trouble managing stress on your own. This information is not intended to replace advice given to you by your health care provider. Make sure you discuss any questions you have with your healthcare provider. Document Revised: 04/05/2020 Document Reviewed: 04/05/2020 Elsevier Patient Education  2022 [employer].

## 2021-03-15 ENCOUNTER — Other Ambulatory Visit: Payer: Self-pay | Admitting: Family Medicine

## 2021-03-18 ENCOUNTER — Other Ambulatory Visit: Payer: Self-pay | Admitting: Family Medicine

## 2021-03-18 ENCOUNTER — Ambulatory Visit: Payer: 59 | Admitting: Family Medicine

## 2021-03-18 ENCOUNTER — Other Ambulatory Visit: Payer: Self-pay

## 2021-03-18 ENCOUNTER — Telehealth: Payer: Self-pay

## 2021-03-18 ENCOUNTER — Encounter: Payer: Self-pay | Admitting: Family Medicine

## 2021-03-18 VITALS — BP 118/73 | HR 77 | Temp 98.1°F | Ht 65.5 in | Wt 243.8 lb

## 2021-03-18 DIAGNOSIS — R739 Hyperglycemia, unspecified: Secondary | ICD-10-CM

## 2021-03-18 DIAGNOSIS — F321 Major depressive disorder, single episode, moderate: Secondary | ICD-10-CM

## 2021-03-18 MED ORDER — TIRZEPATIDE 2.5 MG/0.5ML ~~LOC~~ SOAJ: 2.5000 mg | SUBCUTANEOUS | 0 refills | Status: DC

## 2021-03-18 MED ORDER — AMOXICILLIN-POT CLAVULANATE 875-125 MG PO TABS: 1.0000 | ORAL_TABLET | Freq: Two times a day (BID) | ORAL | 0 refills | Status: DC

## 2021-03-18 NOTE — Telephone Encounter (Signed)
Left message to return call to our office at their convenience.  

## 2021-03-18 NOTE — Telephone Encounter (Signed)
We discussed this at her visit this morning. Did she look into the discount program? She also told me she was willing to use her FSA card to buy out of pocket.   [name]. [name], MD 03/18/2021 11:32 AM

## 2021-03-18 NOTE — Telephone Encounter (Signed)
See below

## 2021-03-18 NOTE — Telephone Encounter (Signed)
Patient is returning a call from [place] about a prescription.

## 2021-03-18 NOTE — Assessment & Plan Note (Signed)
Had a lengthy discussion with patient regarding her symptoms.  She is possibly interested in starting Lexapro at this time.  Symptoms are not well controlled.  She has had several stressful life events recently as well.  We will treat for sinus infection and obesity/hyperglycemia as above and she will check in with me in a couple of weeks.  If still has persistent issues will restart Lexapro 10 mg daily.

## 2021-03-18 NOTE — Assessment & Plan Note (Signed)
Had lengthy discussion with patient regarding treatment options.  She does not like metformin is been effective.  Has previously not been able to afford Ozempic.  We will start Mounjaro.  She will check in with me in a few weeks and we can adjust the dose as tolerated.

## 2021-03-18 NOTE — Telephone Encounter (Signed)
This is not covered by inusrances yet since its new.

## 2021-03-18 NOTE — Telephone Encounter (Signed)
Pt called back to speak to [facility] about medication

## 2021-03-18 NOTE — Progress Notes (Signed)
   Amberley Kohlman is a 60 y.o. female who presents today for an office visit.  Assessment/Plan:  New/Acute Problems: Sinusitis Start Augmentin.  Consider CT sinus if not improving.  She can continue nasal sprays.  Chronic Problems Addressed Today: Morbid obesity ([place]) Had lengthy discussion with patient regarding treatment options.  She does not like metformin is been effective.  Has previously not been able to afford Ozempic.  We will start Mounjaro.  She will check in with me in a few weeks and we can adjust the dose as tolerated.  Depression, major, single episode, moderate ([place]) Had a lengthy discussion with patient regarding her symptoms.  She is possibly interested in starting Lexapro at this time.  Symptoms are not well controlled.  She has had several stressful life events recently as well.  We will treat for sinus infection and obesity/hyperglycemia as above and she will check in with me in a couple of weeks.  If still has persistent issues will restart Lexapro 10 mg daily.     Subjective:  HPI:  See A/P for status of chronic conditions  She has had several weeks to months of facial pain and pressure.  Additionally has some ear pain and facial pain.  She has been trying to take nasal sprays without significant improvement.  Occasionally has headache.  Describes bandlike sensation.  Has had some postnasal drip.  No reported weakness or numbness.  Occasionally gets tingling in the back of her head.  She has been under a lot more stress recently due to several life events.        Objective:  Physical Exam: BP 118/73   Pulse 77   Temp 98.1 F (36.7 C) (Temporal)   Ht 5' 5.5" (1.664 m)   Wt 243 lb 12.8 oz (110.6 kg)   SpO2 96%   BMI 39.95 kg/m   Gen: No acute distress, resting comfortably HEENT: TMs with clear effusion.  OP slightly erythematous with no exudate.  Maxillary sinuses with decreased transillumination bilaterally. CV: Regular rate and rhythm with no  murmurs appreciated Pulm: Normal work of breathing, clear to auscultation bilaterally with no crackles, wheezes, or rhonchi Neuro: Grossly normal, moves all extremities Psych: Normal affect and thought content      Undra Trembath M. [name], MD 03/18/2021 9:27 AM

## 2021-03-18 NOTE — Patient Instructions (Signed)
It was very nice to see you today!  I believe you have a sinus infection.  Please start the Augmentin.  Please let us know if your symptoms or not improving.  Please start the [facility] for weight loss.  Send a message in a few weeks let me know how you are doing with this.  We can discuss starting Lexapro if you are not feeling better is very start the above medications over the next couple of weeks.  Please send me a MyChart message Rebecka Apley me know how you are doing.  Take care, Dr [name]  PLEASE NOTE:  If you had any lab tests please let us know if you have not heard back within a few days. You may see your results on mychart before we have a chance to review them but we will give you a call once they are reviewed by [name]. If we ordered any referrals today, please let us know if you have not heard from their office within the next week.   Please try these tips to maintain a healthy lifestyle:  Eat at least 3 REAL meals and 1-2 snacks per day.  Aim for no more than 5 hours between eating.  If you eat breakfast, please do so within one hour of getting up.   Each meal should contain half fruits/vegetables, one quarter protein, and one quarter carbs (no bigger than a computer mouse)  Cut down on sweet beverages. This includes juice, soda, and sweet tea.   Drink at least 1 glass of water with each meal and aim for at least 8 glasses per day  Exercise at least 150 minutes every week.

## 2021-03-18 NOTE — Telephone Encounter (Signed)
Ok with me. Please place any necessary orders. 

## 2021-03-19 NOTE — Telephone Encounter (Signed)
Called and spoke with pt, see other phone note.

## 2021-03-19 NOTE — Telephone Encounter (Signed)
Called and spoke with pt and she states she went to the pharmacy yesterday and this Rx will cost $1100. She went on [email or web address] and got a few discount codes but will not know how much that will help until she goes back to the pharmacy today, she will call us and updated [name] tomorrow. Then she will let us know if she wants an alternate drug sent in.

## 2021-03-19 NOTE — Telephone Encounter (Signed)
Pharmacy fax note stating Rx Karen Webster not cover by insurance   Send alternative

## 2021-03-19 NOTE — Telephone Encounter (Signed)
I am aware that insurance will not pay for it.   Please see my telephone note. We discussed this at her office visit and patient stated she would look into paying with her FSA card or the drug manufacturer discount.  She was aware insurance would not cover when I prescribed it yesterday.   Can we please call the patient and ask her if she is able to do either of these that we discussed.  If not then we can send in an alternative.  [name]. [name], MD 03/19/2021 9:00 AM

## 2021-04-14 ENCOUNTER — Encounter: Payer: Self-pay | Admitting: Family Medicine

## 2021-04-15 ENCOUNTER — Other Ambulatory Visit: Payer: Self-pay | Admitting: Family Medicine

## 2021-04-15 ENCOUNTER — Other Ambulatory Visit: Payer: Self-pay

## 2021-04-15 MED ORDER — TIRZEPATIDE 2.5 MG/0.5ML ~~LOC~~ SOAJ: 2.5000 mg | SUBCUTANEOUS | 0 refills | Status: DC

## 2021-05-28 ENCOUNTER — Encounter: Payer: Self-pay | Admitting: Family Medicine

## 2021-05-30 NOTE — Telephone Encounter (Signed)
See note

## 2021-06-02 ENCOUNTER — Telehealth: Payer: Self-pay | Admitting: Family Medicine

## 2021-06-02 NOTE — Telephone Encounter (Signed)
Please send in 5mg  dose per my last mychart message.  [name]. [name], MD 06/02/2021 11:20 AM

## 2021-06-02 NOTE — Telephone Encounter (Signed)
Patient is calling in wanting to know why it was denied, advised Dr.Parker did want to schedule a follow up to which patient is scheduled for a virtual on 11/21.

## 2021-06-03 NOTE — Telephone Encounter (Addendum)
Patient is requesting call back as soon as possible.  Would like to know what side effects are of not taking medications if she does not get a refill for tomorrow?  Please see mychart message dated 10/26.

## 2021-06-04 ENCOUNTER — Other Ambulatory Visit: Payer: Self-pay | Admitting: *Deleted

## 2021-06-04 MED ORDER — TIRZEPATIDE 5 MG/0.5ML ~~LOC~~ SOAJ: 5.0000 mg | SUBCUTANEOUS | 4 refills | Status: DC

## 2021-06-04 NOTE — Telephone Encounter (Signed)
Rx send to pharmacy  Patient aware Notified has appt on 06/24/2019 at 9:00

## 2021-06-06 NOTE — Telephone Encounter (Signed)
Rx was send  

## 2021-06-23 ENCOUNTER — Encounter: Payer: Self-pay | Admitting: Family Medicine

## 2021-06-23 ENCOUNTER — Telehealth (INDEPENDENT_AMBULATORY_CARE_PROVIDER_SITE_OTHER): Payer: 59 | Admitting: Family Medicine

## 2021-06-23 DIAGNOSIS — I10 Essential (primary) hypertension: Secondary | ICD-10-CM

## 2021-06-23 MED ORDER — TRIAMTERENE-HCTZ 37.5-25 MG PO CAPS: 1.0000 | ORAL_CAPSULE | Freq: Every day | ORAL | 3 refills | Status: DC

## 2021-06-23 MED ORDER — LOSARTAN POTASSIUM 100 MG PO TABS: 100.0000 mg | ORAL_TABLET | Freq: Every day | ORAL | 3 refills | Status: DC

## 2021-06-23 MED ORDER — AMOXICILLIN-POT CLAVULANATE 875-125 MG PO TABS: 1.0000 | ORAL_TABLET | Freq: Two times a day (BID) | ORAL | 0 refills | Status: DC

## 2021-06-23 NOTE — Assessment & Plan Note (Signed)
She has done well with mounjaro.  Having some constipation but this is tolerable.  She has been on the 5 mg weekly dose for the last couple weeks and is tolerating well.  She is down about 13 pounds over the last couple of months.  We will continue current dose for another couple of months.  We can increase dose to 7.5 mg weekly as tolerated when she runs out of her current prescription.  She will check in with me in a couple of months via [place].

## 2021-06-23 NOTE — Assessment & Plan Note (Signed)
Well-controlled.  Will refill losartan and Dyazide.

## 2021-06-23 NOTE — Progress Notes (Signed)
   Endia Moncur is a 60 y.o. female who presents today for a virtual office visit.  Assessment/Plan:  New/Acute Problems: Sinusitis No red flags.  Will start Augmentin.  She will continue taking Astelin.  Encouraged hydration.  Discussed reasons to return to care.  Chronic Problems Addressed Today: Morbid obesity ([place]) She has done well with mounjaro.  Having some constipation but this is tolerable.  She has been on the 5 mg weekly dose for the last couple weeks and is tolerating well.  She is down about 13 pounds over the last couple of months.  We will continue current dose for another couple of months.  We can increase dose to 7.5 mg weekly as tolerated when she runs out of her current prescription.  She will check in with me in a couple of months via [place].  Essential hypertension Well-controlled.  Will refill losartan and Dyazide.     Subjective:  HPI:  Please see A/P for status of chronic conditions.  She has had symptoms consistent with sinus infection for the last 2 to 3 weeks.  She has tried over-the-counter medications including Sudafed which has not helped.  Associated symptoms include cough, thick yellow mucus production, facial pain.  She has had some hoarseness.  No fevers or chills.       Objective/Observations  Physical Exam: Gen: NAD, resting comfortably Pulm: Normal work of breathing Neuro: Grossly normal, moves all extremities Psych: Normal affect and thought content  Virtual Visit via Video   I connected with Vergia Alcon on 06/23/21 at  9:00 AM EST by a video enabled telemedicine application and verified that I am speaking with the correct person using two identifiers. The limitations of evaluation and management by telemedicine and the availability of in person appointments were discussed. The patient expressed understanding and agreed to proceed.   Patient location: Home Provider location: [place]  participating in the virtual visit: Myself and Patient     [name]. [name], MD 06/23/2021 9:26 AM

## 2021-08-07 ENCOUNTER — Encounter: Payer: Self-pay | Admitting: Family Medicine

## 2021-08-08 ENCOUNTER — Other Ambulatory Visit: Payer: Self-pay | Admitting: *Deleted

## 2021-08-08 MED ORDER — TIRZEPATIDE 7.5 MG/0.5ML ~~LOC~~ SOAJ: 7.5000 mg | SUBCUTANEOUS | 0 refills | Status: AC

## 2021-09-18 ENCOUNTER — Other Ambulatory Visit: Payer: Self-pay | Admitting: *Deleted

## 2021-09-18 ENCOUNTER — Encounter: Payer: Self-pay | Admitting: Family Medicine

## 2021-09-18 MED ORDER — TIRZEPATIDE 10 MG/0.5ML ~~LOC~~ SOAJ: 10.0000 mg | SUBCUTANEOUS | 1 refills | Status: DC

## 2021-09-18 NOTE — Telephone Encounter (Signed)
Ok to increase to 10mg  weekly dose.  [name]. [name], MD 09/18/2021 1:53 PM

## 2021-09-18 NOTE — Telephone Encounter (Signed)
Please advise 

## 2021-09-19 ENCOUNTER — Other Ambulatory Visit: Payer: Self-pay | Admitting: Family Medicine

## 2021-10-11 ENCOUNTER — Other Ambulatory Visit: Payer: Self-pay | Admitting: Obstetrics & Gynecology

## 2021-10-11 DIAGNOSIS — N951 Menopausal and female climacteric states: Secondary | ICD-10-CM

## 2021-10-13 NOTE — Telephone Encounter (Signed)
Last AEX 11/08/20--scheduled 11/13/21. ?Last mammo 10/14/20.  ?

## 2021-10-23 ENCOUNTER — Other Ambulatory Visit: Payer: Self-pay | Admitting: Obstetrics & Gynecology

## 2021-10-23 NOTE — Telephone Encounter (Signed)
Medication refill request: Progesterone  ?Last AEX:  11-08-20 ML ?Next AEX: 11-13-21  ?Last MMG (if hormonal medication request): 10-14-20 ?Refill authorized: Please advise.  ? ?

## 2021-10-24 ENCOUNTER — Encounter: Payer: Self-pay | Admitting: Obstetrics & Gynecology

## 2021-11-02 ENCOUNTER — Other Ambulatory Visit: Payer: Self-pay | Admitting: Obstetrics & Gynecology

## 2021-11-02 DIAGNOSIS — N951 Menopausal and female climacteric states: Secondary | ICD-10-CM

## 2021-11-06 ENCOUNTER — Other Ambulatory Visit: Payer: Self-pay | Admitting: Obstetrics & Gynecology

## 2021-11-06 DIAGNOSIS — N951 Menopausal and female climacteric states: Secondary | ICD-10-CM

## 2021-11-09 ENCOUNTER — Other Ambulatory Visit: Payer: Self-pay | Admitting: Obstetrics & Gynecology

## 2021-11-13 ENCOUNTER — Ambulatory Visit (INDEPENDENT_AMBULATORY_CARE_PROVIDER_SITE_OTHER): Payer: 59 | Admitting: Obstetrics & Gynecology

## 2021-11-13 ENCOUNTER — Encounter: Payer: Self-pay | Admitting: Obstetrics & Gynecology

## 2021-11-13 VITALS — BP 118/76 | HR 72 | Resp 16 | Ht 65.25 in | Wt 226.0 lb

## 2021-11-13 DIAGNOSIS — R300 Dysuria: Secondary | ICD-10-CM

## 2021-11-13 DIAGNOSIS — N950 Postmenopausal bleeding: Secondary | ICD-10-CM

## 2021-11-13 DIAGNOSIS — Z79890 Hormone replacement therapy (postmenopausal): Secondary | ICD-10-CM

## 2021-11-13 DIAGNOSIS — Z01419 Encounter for gynecological examination (general) (routine) without abnormal findings: Secondary | ICD-10-CM

## 2021-11-13 MED ORDER — ESTRADIOL 0.0375 MG/24HR TD PTTW: 1.0000 | MEDICATED_PATCH | TRANSDERMAL | 4 refills | Status: DC

## 2021-11-13 MED ORDER — CIPROFLOXACIN HCL 500 MG PO TABS: 500.0000 mg | ORAL_TABLET | Freq: Two times a day (BID) | ORAL | 0 refills | Status: AC

## 2021-11-13 MED ORDER — PROGESTERONE MICRONIZED 100 MG PO CAPS: 100.0000 mg | ORAL_CAPSULE | Freq: Every day | ORAL | 4 refills | Status: DC

## 2021-11-13 NOTE — Progress Notes (Signed)
? ? ?Karen Webster 1961-05-29 [id number] ? ? ?History:    61 y.o. G1P1L1 Married ?  ?RP:  Established patient presenting for annual gyn exam  ?  ?HPI:  Menopause on HRT with Estradiol 0.05 patch weekly and Prometrium 100 mg at bedtime. Had PMB in Feb and March 2023. Not bleeding currently.  No pelvic pain.  No post coital bleeding.  Pap 11/2020 Neg. Known uterine fibroids. C/o burning with urination, cloudy urine & odor.  No fever. Bowel movements normal.  Breast normal.  Mammo 10/2021 Neg.  Body mass index improved to 37.32. [place] beach.  M[place] to the beach since March 2020. Health labs with family physician.  [place] 2019. ? ?  ?Past medical history,surgical history, family history and social history were all reviewed and documented in the EPIC chart. ? ?Gynecologic History ?Patient's last menstrual period was 10/17/2021 (exact date). ? ?Obstetric History ?OB History  ?Gravida Para Term Preterm AB Living  ?'1 1       1  '$ ?SAB IAB Ectopic Multiple Live Births  ?           ?  ?# Outcome Date GA Lbr Len/2nd Weight Sex Delivery Anes PTL Lv  ?1 Para           ? ? ? ?ROS: A ROS was performed and pertinent positives and negatives are included in the history. ? GENERAL: No fevers or chills. HEENT: No change in vision, no earache, sore throat or sinus congestion. NECK: No pain or stiffness. CARDIOVASCULAR: No chest pain or pressure. No palpitations. PULMONARY: No shortness of breath, cough or wheeze. GASTROINTESTINAL: No abdominal pain, nausea, vomiting or diarrhea, melena or bright red blood per rectum. GENITOURINARY: No urinary frequency, urgency, hesitancy or dysuria. MUSCULOSKELETAL: No joint or muscle pain, no back pain, no recent trauma. DERMATOLOGIC: No rash, no itching, no lesions. ENDOCRINE: No polyuria, polydipsia, no heat or cold intolerance. No recent change in weight. HEMATOLOGICAL: No anemia or easy bruising or bleeding. NEUROLOGIC: No headache, seizures, numbness, tingling or weakness.  PSYCHIATRIC: No depression, no loss of interest in normal activity or change in sleep pattern.  ?  ? ?Exam: ? ? ?BP 118/76   Pulse 72   Resp 16   Ht 5' 5.25" (1.657 m)   Wt 226 lb (102.5 kg)   LMP 10/17/2021 (Exact Date) Comment: spotting  BMI 37.32 kg/m?  ? ?Body mass index is 37.32 kg/m?. ? ?General appearance : Well developed well nourished female. No acute distress ?HEENT: Eyes: no retinal hemorrhage or exudates,  Neck supple, trachea midline, no carotid bruits, no thyroidmegaly ?Lungs: Clear to auscultation, no rhonchi or wheezes, or rib retractions  ?Heart: Regular rate and rhythm, no murmurs or gallops ?Breast:Examined in sitting and supine position were symmetrical in appearance, no palpable masses or tenderness,  no skin retraction, no nipple inversion, no nipple discharge, no skin discoloration, no axillary or supraclavicular lymphadenopathy ?Abdomen: no palpable masses or tenderness, no rebound or guarding ?Extremities: no edema or skin discoloration or tenderness ? ?Pelvic: Vulva: Normal ?            Vagina: No gross lesions or discharge ? Cervix: No gross lesions or discharge ? Uterus  AV, normal size, shape and consistency, non-tender and mobile ? Adnexa  Without masses or tenderness ? Anus: Normal ? ?U/A: Yellow cloudy, protein negative, nitrites negative, white blood cells 20-40, red blood cells negative, bacteria moderate.  Urine culture pending.   ? ? ?Assessment/Plan:  61  y.o. female for annual exam  ? ?1. Well female exam with routine gynecological exam ?Menopause on HRT with Estradiol 0.05 patch weekly and Prometrium 100 mg at bedtime. Had PMB in Feb and March 2023. Not bleeding currently.  No pelvic pain.  No post coital bleeding.  Pap 11/2020 Neg. Known uterine fibroids. C/o burning with urination, cloudy urine & odor.  No fever. Bowel movements normal.  Breast normal.  Mammo 10/2021 Neg.  Body mass index improved to 37.32. Walking every day on the beach.  Moved to the beach since March  2020. Health labs with family physician.  [place] 2019. ? ?2. Postmenopausal hormone replacement therapy ?Menopause on HRT with Estradiol 0.05 patch weekly and Prometrium 100 mg at bedtime. Had PMB in Feb and March 2023. Not bleeding currently.  No pelvic pain.  No post coital bleeding. Will investigate the PMB with a Pelvic US, EBx per results.  Lower Estradiol to 0.0375 patch twice a week and continue Prometrium 100 mg HS.  Prescriptions sent to pharmacy.  May have to stop HRT per US findings.  ?- US Transvaginal Non-OB; Future ? ?3. Postmenopausal bleeding ?Menopause on HRT with Estradiol 0.05 patch weekly and Prometrium 100 mg at bedtime. Had PMB in Feb and March 2023. Not bleeding currently.  No pelvic pain.  No post coital bleeding.  Will investigate the PMB with a Pelvic US, EBx per results.  Lower Estradiol to 0.0375 patch twice a week and continue Prometrium 100 mg HS.  Prescriptions sent to pharmacy.  May have to stop HRT per US findings.  ?- US Transvaginal Non-OB; Future ? ?4. Burning with urination ?C/o burning with urination, cloudy urine & odor.  No fever. U/A abnormal.  Will treat with Cipro 500 mg PO BID x 7 days.  Had hot flushes when took it previously, but no allergy reaction.  Prescription sent to pharmacy.  U. Culture pending. ?- Urinalysis,Complete w/RFL Culture ? ?Other orders ?- ciprofloxacin (CIPRO) 500 MG tablet; Take 1 tablet (500 mg total) by mouth 2 (two) times daily for 7 days. ?- progesterone (PROMETRIUM) 100 MG capsule; Take 1 capsule (100 mg total) by mouth at bedtime. ?- estradiol (VIVELLE-DOT) 0.0375 MG/24HR; Place 1 patch onto the skin 2 (two) times a week.  ? ?Counseling on postmenopausal bleeding in the context of hormone replacement therapy and counseling on acute cystitis and management, documentation reviewed, for 15 minutes. ? ?Princess Bruins MD, 4:09 PM 11/13/2021 ? ?  ?

## 2021-11-15 LAB — URINALYSIS, COMPLETE W/RFL CULTURE
Bilirubin Urine: NEGATIVE
Casts: NONE SEEN /LPF
Crystals: NONE SEEN /HPF
Glucose, UA: NEGATIVE
Hgb urine dipstick: NEGATIVE
Hyaline Cast: NONE SEEN /LPF
Ketones, ur: NEGATIVE
Nitrites, Initial: NEGATIVE
Protein, ur: NEGATIVE
RBC / HPF: NONE SEEN /HPF (ref 0–2)
Specific Gravity, Urine: 1.015 (ref 1.001–1.035)
Yeast: NONE SEEN /HPF
pH: 7 (ref 5.0–8.0)

## 2021-11-15 LAB — URINE CULTURE
MICRO NUMBER:: 13259709
SPECIMEN QUALITY:: ADEQUATE

## 2021-11-15 LAB — CULTURE INDICATED

## 2021-12-08 ENCOUNTER — Encounter: Payer: Self-pay | Admitting: Family Medicine

## 2021-12-09 ENCOUNTER — Ambulatory Visit: Payer: 59 | Admitting: Obstetrics & Gynecology

## 2021-12-09 ENCOUNTER — Encounter: Payer: Self-pay | Admitting: Obstetrics & Gynecology

## 2021-12-09 ENCOUNTER — Ambulatory Visit (INDEPENDENT_AMBULATORY_CARE_PROVIDER_SITE_OTHER): Payer: 59

## 2021-12-09 ENCOUNTER — Other Ambulatory Visit: Payer: Self-pay

## 2021-12-09 VITALS — BP 120/78

## 2021-12-09 DIAGNOSIS — R9389 Abnormal findings on diagnostic imaging of other specified body structures: Secondary | ICD-10-CM

## 2021-12-09 DIAGNOSIS — N950 Postmenopausal bleeding: Secondary | ICD-10-CM

## 2021-12-09 DIAGNOSIS — N393 Stress incontinence (female) (male): Secondary | ICD-10-CM

## 2021-12-09 DIAGNOSIS — Z79890 Hormone replacement therapy (postmenopausal): Secondary | ICD-10-CM

## 2021-12-09 DIAGNOSIS — N840 Polyp of corpus uteri: Secondary | ICD-10-CM

## 2021-12-09 MED ORDER — TIRZEPATIDE 12.5 MG/0.5ML ~~LOC~~ SOAJ: 12.5000 mg | SUBCUTANEOUS | 0 refills | Status: DC

## 2021-12-09 NOTE — Telephone Encounter (Signed)
Called pt and verified DOB; pt is tolerating current dosage well and is ok with increase of dosage to 12.5 mg weekly; advised sending to ot preferred pharmacy ?

## 2021-12-09 NOTE — Telephone Encounter (Signed)
Ok to send in rx for 12.'5mg'$  once weekly if she is ok with going up on the dose. ? ?[name]. [name], MD ?12/09/2021 11:00 AM  ? ?

## 2021-12-09 NOTE — Progress Notes (Signed)
? ? ?  Karen Webster 1961-03-31 [id number] ? ? ?     61 y.o.  G1P1L1 ? ?RP: PMB on HRT for Pelvic US ? ?HPI: Postmenopause on HRT with Estradiol patch weekly decreased from 0.05 to 0.0375 on 11/13/2021 visit, and Prometrium 100 mg at bedtime. Had PMB in Feb and March 2023. Not bleeding currently.  No pelvic pain.  No post coital bleeding.  C/O SUI.  No urgency. ? ? ?OB History  ?Gravida Para Term Preterm AB Living  ?'1 1       1  '$ ?SAB IAB Ectopic Multiple Live Births  ?           ?  ?# Outcome Date GA Lbr Len/2nd Weight Sex Delivery Anes PTL Lv  ?1 Para           ? ? ?Past medical history,surgical history, problem list, medications, allergies, family history and social history were all reviewed and documented in the EPIC chart. ? ? ?Directed ROS with pertinent positives and negatives documented in the history of present illness/assessment and plan. ? ?Exam: ? ?Vitals:  ? 12/09/21 1512  ?BP: 120/78  ? ?General appearance:  Normal ? ?Pelvic US today: T/V images.  Retroverted enlarged uterus with multiple intramural and subserosal fibroids.  The overall uterine size is measured at 10.2 x 8.43 x 7.65.  The largest fibroid is posterior subserosal measured at 5.5 x 6.7 cm.  Partially fluid-filled endometrium with debris's measured at 5.0 mm.  The cavity is distorted by adjacent fibroids.  Possible endometrial polyp on 3D images measured at 0.8 x 0.6 cm with a feeder vessel leading to the cavity.  Right ovary is atrophic in appearance with positive perfusion.  The left ovary is not visualized vaginally or abdominally.  No adnexal mass.  No free fluid in the pelvis. ? ? ?Assessment/Plan:  61 y.o. G1P1  ? ?1. Postmenopausal bleeding ?Postmenopause on HRT with Estradiol patch weekly decreased from 0.05 to 0.0375 on 11/13/2021 visit, and Prometrium 100 mg at bedtime. Had PMB in Feb and March 2023. Not bleeding currently.  No pelvic pain.  No post coital bleeding.  C/O SUI.  No urgency.  Pelvic US findings thoroughly  reviewed.  Probable IU Polyp, f/u Sonohysto. ?- [name] Sonohysterogram; Future ? ?2. Thickened endometrium ?F/U Sonohysto. ?- [name] Sonohysterogram; Future ? ?3. Postmenopausal hormone replacement therapy ?Will continue with Estradiol 0.0375 weekly and Prometrium 100 mg PO HS. ? ?4. Endometrial polyp ?Probable IU Polyp, will further investigate with a Sonohysto. ?- [name] Sonohysterogram; Future ? ?5. SUI (stress urinary incontinence, female) ?Tried PT for pelvic floor reinforcement without improvement.  Will observe at this time.  Avoid overfilling of the bladder.  Kegels recommended. ? ?Other orders ?- Cyanocobalamin (B-12 PO); Take by mouth.  ? ?Princess Bruins MD, 3:22 PM 12/09/2021 ? ? ? ?  ?

## 2021-12-09 NOTE — Telephone Encounter (Signed)
Please advise if needing dosage change, thanks  ?

## 2021-12-23 ENCOUNTER — Telehealth: Payer: Self-pay | Admitting: Family Medicine

## 2021-12-23 ENCOUNTER — Other Ambulatory Visit: Payer: Self-pay | Admitting: *Deleted

## 2021-12-23 MED ORDER — TIRZEPATIDE 15 MG/0.5ML ~~LOC~~ SOAJ
15.0000 mg | SUBCUTANEOUS | 0 refills | Status: DC
Start: 2021-12-23 — End: 2022-02-25

## 2021-12-23 NOTE — Telephone Encounter (Signed)
Pt states she cannot find mounjaro availability at the pharmacies she's called.  Pt states she is due for the next injection tomorrow (05/24).  Pt asks: If she misses this dose, will there be side effects of issues she needs to know about.   Please call back ASAP.

## 2021-12-23 NOTE — Telephone Encounter (Signed)
Patient taking 12.'5mg'$   Rx Mounjaro '15mg'$  send to pharmacy

## 2021-12-23 NOTE — Telephone Encounter (Signed)
Can we clarify what dose she is on? If she is on 12.'5mg'$  weekly we can send in either '10mg'$  or '15mg'$  instead if they have it in stock.  [name]. [name], MD 12/23/2021 3:07 PM

## 2021-12-23 NOTE — Telephone Encounter (Signed)
Please advise 

## 2022-01-02 ENCOUNTER — Encounter: Payer: Self-pay | Admitting: Family Medicine

## 2022-01-02 ENCOUNTER — Ambulatory Visit: Payer: 59 | Admitting: Family Medicine

## 2022-01-02 VITALS — BP 118/84 | HR 96 | Temp 97.3°F | Ht 65.25 in | Wt 223.2 lb

## 2022-01-02 DIAGNOSIS — R300 Dysuria: Secondary | ICD-10-CM | POA: Diagnosis not present

## 2022-01-02 DIAGNOSIS — I10 Essential (primary) hypertension: Secondary | ICD-10-CM

## 2022-01-02 LAB — POC URINALSYSI DIPSTICK (AUTOMATED)
Bilirubin, UA: POSITIVE
Blood, UA: NEGATIVE
Glucose, UA: NEGATIVE
Ketones, UA: NEGATIVE
Nitrite, UA: NEGATIVE
Protein, UA: POSITIVE — AB
Spec Grav, UA: 1.025 (ref 1.010–1.025)
Urobilinogen, UA: 0.2 E.U./dL
pH, UA: 6 (ref 5.0–8.0)

## 2022-01-02 MED ORDER — AMOXICILLIN-POT CLAVULANATE 875-125 MG PO TABS: 1.0000 | ORAL_TABLET | Freq: Two times a day (BID) | ORAL | 0 refills | Status: DC

## 2022-01-02 MED ORDER — ALBUTEROL SULFATE HFA 108 (90 BASE) MCG/ACT IN AERS: 2.0000 | INHALATION_SPRAY | Freq: Four times a day (QID) | RESPIRATORY_TRACT | 0 refills | Status: DC | PRN

## 2022-01-02 MED ORDER — AZELASTINE HCL 0.1 % NA SOLN: 2.0000 | Freq: Two times a day (BID) | NASAL | 12 refills | Status: DC

## 2022-01-02 NOTE — Patient Instructions (Signed)
It was very nice to see you today!  Please start the Augmentin.  We will call you once your urine culture result comes back.  I will also refill your Astelin and albuterol.  Let us know if not improving by next week.  Take care, Dr [name]  PLEASE NOTE:  If you had any lab tests please let us know if you have not heard back within a few days. You may see your results on mychart before we have a chance to review them but we will give you a call once they are reviewed by [name]. If we ordered any referrals today, please let us know if you have not heard from their office within the next week.   Please try these tips to maintain a healthy lifestyle:  Eat at least 3 REAL meals and 1-2 snacks per day.  Aim for no more than 5 hours between eating.  If you eat breakfast, please do so within one hour of getting up.   Each meal should contain half fruits/vegetables, one quarter protein, and one quarter carbs (no bigger than a computer mouse)  Cut down on sweet beverages. This includes juice, soda, and sweet tea.   Drink at least 1 glass of water with each meal and aim for at least 8 glasses per day  Exercise at least 150 minutes every week.

## 2022-01-02 NOTE — Assessment & Plan Note (Signed)
Blood pressure at goal on losartan and Dyazide.  We will continue current regimen.

## 2022-01-02 NOTE — Assessment & Plan Note (Signed)
She is down about 20 pounds since her last visit.  She is doing well with Mounjaro 15 mg daily.  She is having some constipation but this is manageable.  We will continue for now.  We can follow-up at next office visit.

## 2022-01-02 NOTE — Progress Notes (Signed)
   Karen Webster is a 61 y.o. female who presents today for an office visit.  Assessment/Plan:  New/Acute Problems: Sinusitis No red flags.  Given length of symptoms will start Augmentin.  She will continue Astelin.  We will also refill her albuterol inhaler.  She can use over-the-counter meds as needed.  We discussed reasons to return to care.  Follow-up as needed.  UTI No red flags or signs of systemic illness.  History consistent with prior UTIs and she does have positive leukocyte esterase on UA today.  Urine culture is pending.  We will be starting Augmentin above which should treat UTI as well based on her last urine culture.  Encouraged hydration.  We discussed reasons to return to care.  Follow-up as needed.  Chronic Problems Addressed Today: Morbid obesity ([place]) She is down about 20 pounds since her last visit.  She is doing well with Mounjaro 15 mg daily.  She is having some constipation but this is manageable.  We will continue for now.  We can follow-up at next office visit.  Essential hypertension Blood pressure at goal on losartan and Dyazide.  We will continue current regimen.     Subjective:  HPI:  Patient here with concern for sinus infection and UTI.  Her sinus congestion started a couple of weeks ago. Tried taking OTC medications with some improvement however symptoms have been worsening the last couple of days. She has had a lot head pressure. Some shortness of breath when coughing. She has tried astelin which has helped.   She has also noticed some burning with urination for the last couple of days. Similar to previous UTIs. No fevers or chills.        Objective:  Physical Exam: BP 118/84 (BP Location: Left Arm, Patient Position: Sitting, Cuff Size: Large)   Pulse 96   Temp (!) 97.3 F (36.3 C) (Temporal)   Ht 5' 5.25" (1.657 m)   Wt 223 lb 3.2 oz (101.2 kg)   LMP 10/17/2021 (Exact Date) Comment: spotting  SpO2 95%   BMI 36.86 kg/m   Gen: No  acute distress, resting comfortably HEENT: TMs with clear effusion.  OP erythematous.  Nasal mucosaSome boggy bilaterally with clear discharge. CV: Regular rate and rhythm with no murmurs appreciated Pulm: Normal work of breathing, clear to auscultation bilaterally with no crackles, wheezes, or rhonchi Neuro: Grossly normal, moves all extremities Psych: Normal affect and thought content      Shauni Henner M. [name], MD 01/02/2022 12:31 PM

## 2022-01-04 LAB — URINE CULTURE
MICRO NUMBER:: 13476301
SPECIMEN QUALITY:: ADEQUATE

## 2022-01-05 ENCOUNTER — Telehealth: Payer: Self-pay | Admitting: Family Medicine

## 2022-01-05 ENCOUNTER — Encounter: Payer: Self-pay | Admitting: Physician Assistant

## 2022-01-05 ENCOUNTER — Other Ambulatory Visit: Payer: Self-pay

## 2022-01-05 ENCOUNTER — Ambulatory Visit: Payer: 59 | Admitting: Physician Assistant

## 2022-01-05 VITALS — BP 92/67 | HR 96 | Temp 97.1°F | Ht 65.25 in | Wt 219.4 lb

## 2022-01-05 DIAGNOSIS — R051 Acute cough: Secondary | ICD-10-CM | POA: Diagnosis not present

## 2022-01-05 DIAGNOSIS — J0100 Acute maxillary sinusitis, unspecified: Secondary | ICD-10-CM | POA: Diagnosis not present

## 2022-01-05 DIAGNOSIS — R300 Dysuria: Secondary | ICD-10-CM

## 2022-01-05 DIAGNOSIS — R031 Nonspecific low blood-pressure reading: Secondary | ICD-10-CM

## 2022-01-05 MED ORDER — PREDNISONE 20 MG PO TABS: 20.0000 mg | ORAL_TABLET | Freq: Two times a day (BID) | ORAL | 0 refills | Status: DC

## 2022-01-05 MED ORDER — PREDNISONE 20 MG PO TABS: 20.0000 mg | ORAL_TABLET | Freq: Two times a day (BID) | ORAL | 0 refills | Status: AC

## 2022-01-05 NOTE — Telephone Encounter (Signed)
[place] dpt and advised resent Rx to [facility] [address] location per request. Pt verbalized understanding

## 2022-01-05 NOTE — Telephone Encounter (Signed)
Noted  

## 2022-01-05 NOTE — Telephone Encounter (Signed)
Patient scheduled for today at 130 w/ [place].  Routing to both teams.

## 2022-01-05 NOTE — Progress Notes (Signed)
Subjective:    Patient ID: Karen Webster, female    DOB: 10/20/1960, 61 y.o.   MRN: [id number]  Chief Complaint  Patient presents with   Follow-up    Pt c/o not feeling any better since last OV; been taking Augmentin nasal spray and albuterol inhaler; advised recent Urine culture results and confirmed UTI; pt states now ear, throat is sore and has a rattle when breathing. Pt will come back for Shingles vaccine when feeling better    HPI Patient is in today for recheck from [place] with Dr. [name]  Chief complaint: Sinuses Symptom onset: 2 weeks ago Pertinent positives: Sinus congestion, headaches, cough, wheezing / rattling feeling, some ear pain, and ST  Pertinent negatives: Dysuria (resolved)  Treatments tried: Currently on Augmentin, Motrin / Tylenol Sick exposure: No known sick contacts   Past Medical History:  Diagnosis Date   Arthritis    knees   GERD (gastroesophageal reflux disease)    Hypertension    Seasonal allergies    SUI (stress urinary incontinence, female)     Past Surgical History:  Procedure Laterality Date   CATARACT EXTRACTION, BILATERAL     CHOLECYSTECTOMY     COLONOSCOPY  2018   HYSTEROSCOPY N/A 11/17/2017   Procedure: HYSTEROSCOPY FOR REMOVAL OF IUD;  Surgeon: Princess Bruins, MD;  Location: [facility];  Service: Gynecology;  Laterality: N/A;   KNEE SURGERY     Right knee, Dr Reynaldo Minium   NASAL SEPTUM SURGERY     WISDOM TOOTH EXTRACTION      Family History  Problem Relation Age of Onset   Dementia Mother    Hypertension Mother    Hypertension Father    Atrial fibrillation Father    Dementia Maternal Grandmother    Heart disease Maternal Grandfather    Breast cancer Paternal Grandmother    Cancer Paternal Grandmother    Heart disease Paternal Grandfather     Social History   Tobacco Use   Smoking status: Never   Smokeless tobacco: Never  Vaping Use   Vaping Use: Never used  Substance Use Topics   Alcohol use: Yes     Comment: SOCIAL    Drug use: Never     Allergies  Allergen Reactions   Nitrofurantoin Hives and Rash   Macrodantin [Nitrofurantoin Macrocrystal] Hives   Tetracycline Hives   Tetracyclines & Related Hives   Bactrim [Sulfamethoxazole-Trimethoprim] Rash    Review of Systems NEGATIVE UNLESS OTHERWISE INDICATED IN HPI      Objective:     BP 92/67 (BP Location: Left Arm)   Pulse 96   Temp (!) 97.1 F (36.2 C) (Temporal)   Ht 5' 5.25" (1.657 m)   Wt 219 lb 6.4 oz (99.5 kg)   LMP 10/17/2021 (Exact Date) Comment: spotting  SpO2 96%   BMI 36.23 kg/m   Wt Readings from Last 3 Encounters:  01/05/22 219 lb 6.4 oz (99.5 kg)  01/02/22 223 lb 3.2 oz (101.2 kg)  11/13/21 226 lb (102.5 kg)    BP Readings from Last 3 Encounters:  01/05/22 92/67  01/02/22 118/84  12/09/21 120/78     Physical Exam Vitals and nursing note reviewed.  Constitutional:      General: She is not in acute distress.    Appearance: Normal appearance. She is not ill-appearing.  HENT:     Head: Normocephalic.     Right Ear: Tympanic membrane, ear canal and external ear normal.     Left Ear: Tympanic membrane, ear  canal and external ear normal.     Nose: Congestion present.     Mouth/Throat:     Mouth: Mucous membranes are moist.     Pharynx: No oropharyngeal exudate or posterior oropharyngeal erythema.  Eyes:     Extraocular Movements: Extraocular movements intact.     Conjunctiva/sclera: Conjunctivae normal.     Pupils: Pupils are equal, round, and reactive to light.  Cardiovascular:     Rate and Rhythm: Normal rate and regular rhythm.     Pulses: Normal pulses.     Heart sounds: Normal heart sounds. No murmur heard. Pulmonary:     Effort: Pulmonary effort is normal. No respiratory distress.     Breath sounds: Normal breath sounds. No wheezing.  Musculoskeletal:     Cervical back: Normal range of motion.  Skin:    General: Skin is warm.  Neurological:     Mental Status: She is alert and  oriented to person, place, and time.  Psychiatric:        Mood and Affect: Mood normal.        Behavior: Behavior normal.        Assessment & Plan:   Problem List Items Addressed This Visit   None Visit Diagnoses     Acute maxillary sinusitis, recurrence not specified    -  Primary   Acute cough       Dysuria       Low blood pressure reading            Meds ordered this encounter  Medications   DISCONTD: predniSONE (DELTASONE) 20 MG tablet    Sig: Take 1 tablet (20 mg total) by mouth 2 (two) times daily with a meal for 5 days.    Dispense:  10 tablet    Refill:  0    Order Specific Question:   Supervising Provider    Answer:   Marin Olp [[id number]]   PLAN: -Finish the augmentin -add prednisone for relief (no NSAIDs with this) -daily allergy medication, push fluids -Consider additional treatment by end of week if worse or no improvement   Return if symptoms worsen or fail to improve.  This note was prepared with assistance of Systems analyst. Occasional wrong-word or sound-a-like substitutions may have occurred due to the inherent limitations of voice recognition software.    Orla Estrin M Nathali Vent, PA-C

## 2022-01-05 NOTE — Telephone Encounter (Signed)
Can we have Karen Webster come in for an office visit if Karen Webster symptoms are getting worse?  [name]. [name], MD 01/05/2022 11:41 AM

## 2022-01-05 NOTE — Telephone Encounter (Signed)
Patient called  this morning- would like dr parkers recommendations   Patient Name: Karen Webster Gender: Female DOB: Sep 22, 1960 Age: 61 Y 1 M 26 D Return Phone Number: [phone] (Primary) Address: City/ State/ Zip: [place]  [address] Client [place] [facility] [place] Client Site [place] [facility] [place] Night Provider Dimas Chyle- MD Contact Type Call Who Is Calling Patient / Member / Family / Caregiver Call Type Triage / Clinical Relationship To Patient Self Return Phone Number [phone] (Primary) Chief Complaint WHEEZING Reason for Call Symptomatic / Request for Health Information Initial Comment Caller was given medication for sinus and ear infection, wheezing, vomiting, has questions about the urine culture. Translation No Nurse Assessment Nurse: Hardin Negus, RN, Mardene Celeste Date/Time Eilene Ghazi Time): 01/05/2022 7:48:10 AM Confirm and document reason for call. If symptomatic, describe symptoms. ---Caller was given ABX for sinus and ear infection. She is headache, wheezing, vomiting and no fever. She has questions about the urine culture. She is congested, and sounds hoarse. She vomited a couple times this past weekend. She is coughing green phlegm. Does the patient have any new or worsening symptoms? ---Yes Will a triage be completed? ---Yes Related visit to physician within the last 2 weeks? ---Yes Does the PT have any chronic conditions? (i.e. diabetes, asthma, this includes High risk factors for pregnancy, etc.) ---Yes List chronic conditions. ---HTN Is this a behavioral health or substance abuse call? ---No Guidelines Guideline Title Affirmed Question Affirmed Notes Nurse Date/Time Eilene Ghazi Time) Sinus Infection on Antibiotic Follow-up Call [1] SEVERE sinus pain AND [2] not improved 2 hours after pain medicine Hardin Negus, RN, Mardene Celeste 01/05/2022 7:50:35 AM PLEASE NOTE: All timestamps contained within this  report are represented as [place] Standard Time. CONFIDENTIALTY NOTICE: This fax transmission is intended only for the addressee. It contains information that is legally privileged, confidential or otherwise protected from use or disclosure. If you are not the intended recipient, you are strictly prohibited from reviewing, disclosing, copying using or disseminating any of this information or taking any action in reliance on or regarding this information. If you have received this fax in error, please notify us immediately by telephone so that we can arrange for its return to [name]. Phone: [phone], Toll-Free: [phone], Fax: [phone] Page: 2 of 2 Call Id: [id number] [place]. Time Eilene Ghazi Time) Disposition Final User 01/05/2022 7:45:23 AM Send to Urgent Clarnce Flock 01/05/2022 7:52:05 AM See HCP within 4 Hours (or PCP triage) Yes Hardin Negus, RN, [place] Disagree/Comply Comply Caller Understands Yes PreDisposition Call Doctor Care Advice Given Per Guideline SEE HCP (OR PCP TRIAGE) WITHIN 4 HOURS: * IF OFFICE WILL BE OPEN: You need to be seen within the next 3 or 4 hours. Call your doctor (or NP/PA) now or as soon as the office opens. LOCAL COLD: CALL BACK IF: * You become worse CARE ADVICE given per Sinus Infection on Antibiotic Follow-Up Call (Adult) guideline. Referrals REFERRED TO PCP OFFIC

## 2022-01-05 NOTE — Telephone Encounter (Signed)
Patient is not feeling well even after antibiotics - patient states she is now feeling worse- patients throat is now hurting - patient is still having chest congestion-   Patient would like a phone call from provider and results from culture from last week -

## 2022-01-05 NOTE — Progress Notes (Signed)
Please inform patient of the following:  Her urine culture is positive for UTI.  The Augmentin we have her on should treat this.  Would like for her to let us know if symptoms or not improving.

## 2022-01-05 NOTE — Telephone Encounter (Signed)
Pt calls with desire to have pred[facility]one 20 [id number] States her initial pharmacy has no power. Please callback pt once this has been rerouted at [phone]

## 2022-01-05 NOTE — Patient Instructions (Signed)
Take the prednisone as directed - do not take with NSAIDs (aleve, ibuprofen etc) Daily allergy medication may help as well (claritin, zyrtec, etc). Let me know if still no improvement near the end of this week - consider Rocephin 1 g injection at that time.  Feel better! Rest! Hydrate!   Monitor BP.

## 2022-01-05 NOTE — Telephone Encounter (Signed)
See note

## 2022-01-19 ENCOUNTER — Other Ambulatory Visit: Payer: Self-pay

## 2022-01-19 ENCOUNTER — Ambulatory Visit: Payer: 59 | Admitting: Physician Assistant

## 2022-01-19 ENCOUNTER — Encounter (HOSPITAL_BASED_OUTPATIENT_CLINIC_OR_DEPARTMENT_OTHER): Payer: Self-pay

## 2022-01-19 ENCOUNTER — Telehealth: Payer: Self-pay | Admitting: Family Medicine

## 2022-01-19 ENCOUNTER — Emergency Department (HOSPITAL_BASED_OUTPATIENT_CLINIC_OR_DEPARTMENT_OTHER): Payer: 59 | Admitting: Radiology

## 2022-01-19 ENCOUNTER — Emergency Department (HOSPITAL_BASED_OUTPATIENT_CLINIC_OR_DEPARTMENT_OTHER)
Admission: EM | Admit: 2022-01-19 | Discharge: 2022-01-19 | Disposition: A | Discharge status: Home / Self Care | Payer: 59 | Attending: Emergency Medicine | Admitting: Emergency Medicine

## 2022-01-19 ENCOUNTER — Encounter: Payer: Self-pay | Admitting: Physician Assistant

## 2022-01-19 VITALS — HR 88 | Temp 97.5°F | Ht 65.25 in | Wt 221.6 lb

## 2022-01-19 DIAGNOSIS — Z8249 Family history of ischemic heart disease and other diseases of the circulatory system: Secondary | ICD-10-CM

## 2022-01-19 DIAGNOSIS — R0789 Other chest pain: Secondary | ICD-10-CM | POA: Diagnosis not present

## 2022-01-19 DIAGNOSIS — R42 Dizziness and giddiness: Secondary | ICD-10-CM | POA: Diagnosis not present

## 2022-01-19 DIAGNOSIS — D72829 Elevated white blood cell count, unspecified: Secondary | ICD-10-CM | POA: Insufficient documentation

## 2022-01-19 DIAGNOSIS — M549 Dorsalgia, unspecified: Secondary | ICD-10-CM

## 2022-01-19 DIAGNOSIS — R519 Headache, unspecified: Secondary | ICD-10-CM | POA: Diagnosis not present

## 2022-01-19 DIAGNOSIS — Z79899 Other long term (current) drug therapy: Secondary | ICD-10-CM | POA: Diagnosis not present

## 2022-01-19 DIAGNOSIS — R079 Chest pain, unspecified: Secondary | ICD-10-CM | POA: Diagnosis present

## 2022-01-19 DIAGNOSIS — I10 Essential (primary) hypertension: Secondary | ICD-10-CM | POA: Insufficient documentation

## 2022-01-19 LAB — BASIC METABOLIC PANEL
Anion gap: 11 (ref 5–15)
BUN: 11 mg/dL (ref 8–23)
CO2: 27 mmol/L (ref 22–32)
Calcium: 9.4 mg/dL (ref 8.9–10.3)
Chloride: 98 mmol/L (ref 98–111)
Creatinine, Ser: 0.85 mg/dL (ref 0.44–1.00)
GFR, Estimated: >60 mL/min (ref >60)
Glucose, Bld: 96 mg/dL (ref 70–99)
Potassium: 3.5 mmol/L (ref 3.5–5.1)
Sodium: 136 mmol/L (ref 135–145)

## 2022-01-19 LAB — TROPONIN I (HIGH SENSITIVITY)
Troponin I (High Sensitivity): <2 ng/L (ref <18)
Troponin I (High Sensitivity): <2 ng/L (ref <18)

## 2022-01-19 LAB — CBC
HCT: 40.7 % (ref 36.0–46.0)
Hemoglobin: 13.6 g/dL (ref 12.0–15.0)
MCH: 29.7 pg (ref 26.0–34.0)
MCHC: 33.4 g/dL (ref 30.0–36.0)
MCV: 88.9 fL (ref 80.0–100.0)
Platelets: 353 10*3/uL (ref 150–400)
RBC: 4.58 MIL/uL (ref 3.87–5.11)
RDW: 12.9 % (ref 11.5–15.5)
WBC: 11.2 10*3/uL — ABNORMAL HIGH (ref 4.0–10.5)
nRBC: 0 % (ref 0.0–0.2)

## 2022-01-19 LAB — D-DIMER, QUANTITATIVE: D-Dimer, Quant: 0.29 ug/mL-FEU (ref 0.00–0.50)

## 2022-01-19 MED ORDER — SODIUM CHLORIDE 0.9 % IV SOLN: INTRAVENOUS | Status: DC

## 2022-01-19 MED ORDER — SODIUM CHLORIDE 0.9 % IV BOLUS
1000.0000 mL | Freq: Once | INTRAVENOUS | Status: AC
  Administered 2022-01-19: 1000 mL via INTRAVENOUS

## 2022-01-19 MED ORDER — ACETAMINOPHEN 325 MG PO TABS
650.0000 mg | ORAL_TABLET | Freq: Once | ORAL | Status: AC
Start: 2022-01-19 — End: 2022-01-19
  Administered 2022-01-19: 650 mg via ORAL
  Filled 2022-01-19: qty 2

## 2022-01-19 NOTE — Telephone Encounter (Signed)
Please see triage notes 

## 2022-01-19 NOTE — Patient Instructions (Addendum)
Thanks for coming in today.  Recommend going to [facility] ED at this time for further eval / rule out cardiac etiology.

## 2022-01-19 NOTE — ED Triage Notes (Signed)
Patient here POV from PCP.  Endorses having Dizziness yesterday afternoon that has continued since. Also endorses having Back Pain that began yesterday PM.  Otherwise she has been seeing PCP for Sinus Infection for approximately 2 Weeks.   90/60 BP and 101 HR at PCP. Sent by Same to have Cardiac Assessment.   NAD Noted during Triage. A&Ox4. GCS 15. Ambulatory.

## 2022-01-19 NOTE — Progress Notes (Signed)
Subjective:    Patient ID: Karen Webster, female    DOB: 1961/07/24, 61 y.o.   MRN: [id number]  Chief Complaint  Patient presents with   Blood pressure Medication    Pt thinks BP meds may need adjusting; took Orthostatic vitals for pt; pt has dizziness at times, yesterday it had dropped, pt didn't take HCTZ today but did take Losartan today. This morning BP 118/79; last night 92/66 pulse was 101.     HPI Patient is in today for dizziness. Felt really dizzy on the way home from her dad's house last night. Headache, but no back pain at that time.   Pain in upper back and neck starting yesterday. Pressure pain left shoulder blade. Constant pain, feels like pressure weights across upper back and neck. Headache, tingling through back of head.  No chest pain though. No SOB. No n/v. No vision changes.  Took Losartan 100 mg this morning, but not the Dyazide. 118/79 this morning, prior to taking Losartan. Feeling more dizzy as day is going on.   Previously tested for heart valve issue- says this runs on her father's side; brother passed from this at age 22. Pt very concerned about cardiac issues causing her symptoms today.   Past Medical History:  Diagnosis Date   Arthritis    knees   GERD (gastroesophageal reflux disease)    Hypertension    Seasonal allergies    SUI (stress urinary incontinence, female)     Past Surgical History:  Procedure Laterality Date   CATARACT EXTRACTION, BILATERAL     CHOLECYSTECTOMY     COLONOSCOPY  2018   HYSTEROSCOPY N/A 11/17/2017   Procedure: HYSTEROSCOPY FOR REMOVAL OF IUD;  Surgeon: Princess Bruins, MD;  Location: [facility];  Service: Gynecology;  Laterality: N/A;   KNEE SURGERY     Right knee, Dr Reynaldo Minium   NASAL SEPTUM SURGERY     WISDOM TOOTH EXTRACTION      Family History  Problem Relation Age of Onset   Dementia Mother    Hypertension Mother    Hypertension Father    Atrial fibrillation Father    Heart attack  Brother    Dementia Maternal Grandmother    Heart disease Maternal Grandfather    Breast cancer Paternal Grandmother    Cancer Paternal Grandmother    Heart disease Paternal Grandfather     Social History   Tobacco Use   Smoking status: Never   Smokeless tobacco: Never  Vaping Use   Vaping Use: Never used  Substance Use Topics   Alcohol use: Yes    Comment: SOCIAL    Drug use: Never     Allergies  Allergen Reactions   Nitrofurantoin Hives and Rash   Macrodantin [Nitrofurantoin Macrocrystal] Hives   Tetracycline Hives   Tetracyclines & Related Hives   Bactrim [Sulfamethoxazole-Trimethoprim] Rash    Review of Systems NEGATIVE UNLESS OTHERWISE INDICATED IN HPI      Objective:     Pulse 88   Temp (!) 97.5 F (36.4 C) (Temporal)   Ht 5' 5.25" (1.657 m)   Wt 221 lb 9.6 oz (100.5 kg)   LMP 10/17/2021 (Exact Date) Comment: spotting  SpO2 96%   BMI 36.59 kg/m   Wt Readings from Last 3 Encounters:  01/19/22 221 lb 9.6 oz (100.5 kg)  01/05/22 219 lb 6.4 oz (99.5 kg)  01/02/22 223 lb 3.2 oz (101.2 kg)    BP Readings from Last 3 Encounters:  01/05/22 92/67  01/02/22  118/84  12/09/21 120/78     Physical Exam Vitals and nursing note reviewed.  Constitutional:      Appearance: Normal appearance. She is normal weight. She is not toxic-appearing.  HENT:     Head: Normocephalic and atraumatic.  Eyes:     Extraocular Movements: Extraocular movements intact.     Conjunctiva/sclera: Conjunctivae normal.     Pupils: Pupils are equal, round, and reactive to light.  Cardiovascular:     Rate and Rhythm: Normal rate and regular rhythm.     Pulses: Normal pulses.     Heart sounds: Normal heart sounds. No murmur heard. Pulmonary:     Effort: Pulmonary effort is normal.     Breath sounds: Normal breath sounds. No wheezing.  Musculoskeletal:        General: Normal range of motion.     Cervical back: Normal range of motion and neck supple.     Comments: Some tenderness  with palpation left shoulder region; upper back and shoulders.  Skin:    General: Skin is warm and dry.  Neurological:     General: No focal deficit present.     Mental Status: She is alert and oriented to person, place, and time.     Cranial Nerves: No cranial nerve deficit.     Motor: No weakness.     Gait: Gait normal.  Psychiatric:     Comments: Slightly anxious        Assessment & Plan:   Problem List Items Addressed This Visit   None Visit Diagnoses     Dizziness    -  Primary   Relevant Orders   EKG 12-Lead (Completed)   Family history of heart disease       Upper back pain       Occipital headache           1. Dizziness 2. Family history of heart disease 3. Upper back pain 4. Occipital headache -Reassured pt that ECG appears to be in NSR w/o ST or T wave changes, 67 bpm, I personally reviewed today. However, with current symptoms and family history, I do advise eval at ED today for STAT labs, cardiac work-up.  -Blood pressure has been running lower than normal and I talked with her that I do think she is on too much medication for her BP - would need to talk with PCP about lowering dose. -Stable for discharge, she will drive herself to ED to get checked out now   This note was prepared with assistance of Dragon voice recognition software. Occasional wrong-word or sound-a-like substitutions may have occurred due to the inherent limitations of voice recognition software.  Time Spent: In addition to time spent for ekg, I spent 31 minutes of total time on the date of the encounter performing the following actions: chart review prior to seeing the patient, obtaining history, performing a medically necessary exam, counseling on the treatment plan, placing orders, and documenting in our EHR.      Shey Bartmess M Shiree Altemus, PA-C

## 2022-01-19 NOTE — Telephone Encounter (Signed)
  Seeign alyssa at 1130am today - leave as 30 minutes or change to 49mn ov?   Patient states her BP med may be too high since she has lost a lot of weight and its making her feel funny -   Patient Name: Karen SEVCIKGender: Female DOB: 4Jul 18, 1962Age: [age]Y 2 M 9 D Return Phone Number: [phone](Primary) Address: City[place] Zip[place]  [address] PDimas Chyle MD Contact Type Call Who Is Calling Patient / Member / Family / Caregiver Call Type Triage / Clinical Relationship To Patient Self Return Phone Number [phone](Primary) Chief Complaint Dizziness Reason for Call Symptomatic / Request for HAvellastates she's feeling dizzy and her BP is 101/69. Translation No Nurse Assessment Nurse: LWynetta Emery RN, OManuella GhaziDate/Time (Eastern Time): 01/18/2022 9:12:31 PM Confirm and document reason for call. If symptomatic, describe symptoms. ---Caller states is feeling dizzy, and her BP is running a little low now 95/66. takes losartan and Maxide. Still feels dizzy when she stands up but not when sitting. Has had 2 large bottles of water today. Does the patient have any new or worsening symptoms? ---Yes Will a triage be completed? ---Yes Related visit to physician within the last 2 weeks? ---No Does the PT have any chronic conditions? (i.e. diabetes, asthma, this includes High risk factors for pregnancy, etc.) ---Yes List chronic conditions. ---HTN, Is this a behavioral health or substance abuse call? ---No Guidelines Guideline Title Affirmed Question Affirmed Notes Nurse Date/Time (Eastern Time) Dizziness - Lightheadedness Taking a medicine that could cause dizziness (e.g., blood pressure medications, diuretics) LWynetta Emery RN, [place] 01/18/2022 9:17:16 PM Disp. Time (Eilene GhaziTime) Disposition Final  User PLEASE NOTE: All timestamps contained within this report are represented as [place]. CONFIDENTIALTY NOTICE: This fax transmission is intended only for the addressee. It contains information that is legally privileged, confidential or otherwise protected from use or disclosure. If you are not the intended recipient, you are strictly prohibited from reviewing, disclosing, copying using or disseminating any of this information or taking any action in reliance on or regarding this information. If you have received this fax in error, please notify [place]mediately by telephone so that we can arrange for its return to [place] Phone: [phone] Toll-Free: [phone] Fax: [phone]: 2 of 2 Call Id: [id number]/18/2023 9:24:17 PM See PCP within 24 Hours Yes LWynetta Emery RN, OManuella GhaziCaller Disagree/Comply Comply Caller Understands Yes PreDisposition Call Doctor Care Advice Given Per Guideline SEE PCP WITHIN 24 HOURS: * IF OFFICE WILL BE OPEN: You need to be examined within the next 24 hours. Call your doctor (or NP/PA) when the office opens and make an appointment. DRINK FLUIDS: * Drink several glasses of fruit juice, other clear fluids or water. * This will improve hydration and blood glucose. CALL BACK IF: * Passes out (faints) * You become worse CARE ADVICE given per Dizziness (Adult) guideline. Comments User: OPatrina Levering RN Date/Time (Eastern Time): 01/18/2022 9:25:54 PM BP sitting 110/74 HR 96 BP standing 118/83 HR 103 Referrals REFERRED TO PCP OFFICE

## 2022-01-19 NOTE — ED Provider Notes (Signed)
[facility] EMERGENCY DEPT Provider Note   CSN: [id number] Arrival date & time: 01/19/22  1231     History  Chief Complaint  Patient presents with   Dizziness    Karen Webster is a 61 y.o. female.  Patient followed by [place] [facility] primary care.  Patient sent in for evaluation of chest pain really kind of in the back area between the shoulder blades.  This started last evening.  Has continued throughout today.  Not associated with any shortness of breath.  Blood pressures have also been a little bit on the low side apparently at the primary care office it was around 90.  Patient is known hypertensive on to hypertensive meds.  Did not have her hydrochlorothiazide today.  Her primary wanted her to hold that and continue to hold that.  Sent here for cardiac assessment.  Patient is also had some dizziness on and off now for several weeks.  No speech problems no numbness no weakness.  The onset of the chest pain was last evening.  No nausea or vomiting.  No syncope or near syncope.  She has longstanding right leg swelling and it is unchanged.  Patient does relay that she is lost 30 pounds lately and was trying to do that.       Home Medications Prior to Admission medications   Medication Sig Start Date End Date Taking? Authorizing Provider  albuterol (VENTOLIN HFA) 108 (90 Base) MCG/ACT inhaler Inhale 2 puffs into the lungs every 6 (six) hours as needed for wheezing or shortness of breath. Patient not taking: Reported on 01/19/2022 01/02/22   Vivi Barrack, MD  amoxicillin-clavulanate (AUGMENTIN) 875-125 MG tablet Take 1 tablet by mouth 2 (two) times daily. Patient not taking: Reported on 01/19/2022 01/02/22   Vivi Barrack, MD  azelastine (ASTELIN) 0.1 % nasal spray Place 2 sprays into both nostrils 2 (two) times daily. Use in each nostril as directed Patient not taking: Reported on 01/19/2022 01/02/22   Vivi Barrack, MD  Cholecalciferol (VITAMIN D3 PO) Take 1,000 Units by  mouth.    [provider]  Cyanocobalamin (B-12 PO) Take by mouth.    [provider]  estradiol (VIVELLE-DOT) 0.0375 MG/24HR Place 1 patch onto the skin 2 (two) times a week. 11/13/21   Princess Bruins, MD  losartan (COZAAR) 100 MG tablet Take 1 tablet (100 mg total) by mouth daily. 06/23/21   Vivi Barrack, MD  omeprazole (PRILOSEC) 20 MG capsule TAKE ONE CAPSULE BY MOUTH DAILY 09/19/21   Vivi Barrack, MD  progesterone (PROMETRIUM) 100 MG capsule Take 1 capsule (100 mg total) by mouth at bedtime. 11/13/21   Princess Bruins, MD  tirzepatide [facility]) 15 MG/0.5ML Pen Inject 15 mg into the skin once a week. 12/23/21   Vivi Barrack, MD  triamterene-hydrochlorothiazide (DYAZIDE) 37.5-25 MG capsule Take 1 each (1 capsule total) by mouth daily. Patient not taking: Reported on 01/19/2022 06/23/21   Vivi Barrack, MD      Allergies    Nitrofurantoin, Macrodantin [nitrofurantoin macrocrystal], Tetracycline, Tetracyclines & related, and Bactrim [sulfamethoxazole-trimethoprim]    Review of Systems   Review of Systems  Constitutional:  Negative for chills and fever.  HENT:  Negative for ear pain and sore throat.   Eyes:  Negative for pain and visual disturbance.  Respiratory:  Negative for cough and shortness of breath.   Cardiovascular:  Positive for chest pain and leg swelling. Negative for palpitations.  Gastrointestinal:  Negative for abdominal pain and  vomiting.  Genitourinary:  Negative for dysuria and hematuria.  Musculoskeletal:  Negative for arthralgias and back pain.  Skin:  Negative for color change and rash.  Neurological:  Positive for dizziness. Negative for seizures and syncope.  All other systems reviewed and are negative.   Physical Exam Updated Vital Signs BP 106/62 (BP Location: Right Arm)   Pulse 73   Temp 98.8 F (37.1 C)   Resp 15   Ht 1.588 m (5' 2.5")   Wt 100.5 kg   LMP 10/17/2021 (Exact Date) Comment: spotting  SpO2 98%   BMI 39.88  kg/m  Physical Exam Vitals and nursing note reviewed.  Constitutional:      General: She is not in acute distress.    Appearance: Normal appearance. She is well-developed.  HENT:     Head: Normocephalic and atraumatic.     Mouth/Throat:     Mouth: Mucous membranes are moist.  Eyes:     Extraocular Movements: Extraocular movements intact.     Conjunctiva/sclera: Conjunctivae normal.     Pupils: Pupils are equal, round, and reactive to light.  Cardiovascular:     Rate and Rhythm: Normal rate and regular rhythm.     Heart sounds: No murmur heard. Pulmonary:     Effort: Pulmonary effort is normal. No respiratory distress.     Breath sounds: Normal breath sounds.  Abdominal:     Palpations: Abdomen is soft.     Tenderness: There is no abdominal tenderness.  Musculoskeletal:        General: No swelling.     Cervical back: Normal range of motion and neck supple.     Right lower leg: No edema.     Left lower leg: No edema.  Skin:    General: Skin is warm and dry.     Capillary Refill: Capillary refill takes less than 2 seconds.  Neurological:     General: No focal deficit present.     Mental Status: She is alert and oriented to person, place, and time.     Cranial Nerves: No cranial nerve deficit.     Motor: No weakness.  Psychiatric:        Mood and Affect: Mood normal.     ED Results / Procedures / Treatments   Labs (all labs ordered are listed, but only abnormal results are displayed) Labs Reviewed  CBC - Abnormal; Notable for the following components:      Result Value   WBC 11.2 (*)    All other components within normal limits  BASIC METABOLIC PANEL  D-DIMER, QUANTITATIVE  TROPONIN I (HIGH SENSITIVITY)  TROPONIN I (HIGH SENSITIVITY)    EKG EKG Interpretation  Date/Time:  Monday January 19 2022 13:00:38 EDT Ventricular Rate:  80 PR Interval:  146 QRS Duration: 88 QT Interval:  368 QTC Calculation: 424 R Axis:   204 Text Interpretation: Normal sinus rhythm  Right superior axis deviation Cannot rule out Anterior infarct , age undetermined Abnormal ECG No previous ECGs available Changed since previous Confirmed by Fredia Sorrow [phone]) on 01/19/2022 3:59:26 PM  Radiology DG Chest 2 View  Result Date: 01/19/2022 CLINICAL DATA:  Shortness of breath.  Hypotension. EXAM: CHEST - 2 VIEW COMPARISON:  Two-view chest x-ray 06/22/2017 FINDINGS: Heart size is normal. Changes of COPD are again noted. No airspace disease is present. No edema or effusion is present. Mild degenerative changes of the thoracic spine are stable. IMPRESSION: No acute cardiopulmonary disease or significant interval change. Electronically Signed   By:  San Morelle M.D.   On: 01/19/2022 13:31    Procedures Procedures    Medications Ordered in ED Medications  0.9 %  sodium chloride infusion (has no administration in time range)  sodium chloride 0.9 % bolus 1,000 mL (0 mLs Intravenous Stopped 01/19/22 1721)  acetaminophen (TYLENOL) tablet 650 mg (650 mg Oral Given 01/19/22 1651)    ED Course/ Medical Decision Making/ A&P                           Medical Decision Making Amount and/or Complexity of Data Reviewed Labs: ordered. Radiology: ordered.  Risk OTC drugs. Prescription drug management.   Patient with blood pressures here in the low 100s.  EKG without any acute changes.  Chest x-ray negative.  Troponins x2 less than 2.  D-dimer not elevated.  Basic metabolic panel normal normal renal function CBC a mild leukocytosis at 11.2 and hemoglobin 13.6.  Patient given some IV fluids here.  Blood pressure still low 100s.  Feel that it is best for her to hold both blood pressure medicines follow back up with her primary care doctor.  May need MRI if the dizziness persists.  So patient has had 30 pound weight loss apparently she was trying to do this.  At would also consider at some point may be further evaluation may be for an occult neoplastic process.   Final Clinical  Impression(s) / ED Diagnoses Final diagnoses:  Atypical chest pain    Rx / DC Orders ED Discharge Orders     None         Fredia Sorrow, MD 01/19/22 [phone]

## 2022-01-19 NOTE — ED Notes (Signed)
Patient given discharge instructions. Questions were answered. Patient verbalized understanding of discharge instructions and care at home.  

## 2022-01-19 NOTE — Discharge Instructions (Signed)
Cardiac work-up without any acute findings.  Cardiac markers called troponins were normal.  Screening test for blood clot D-dimer was normal as well.  Blood pressure here has been a little bit marginal not hypotensive but I would recommend stopping your other blood pressure medicine as well.  And just see what your blood pressure does off of any the meds.  Follow back up with your primary care doctor to have blood pressures rechecked.  Also follow back up for additional work-up based on today's symptoms.  Return for any new or worse symptoms.  Also today clinically no evidence of any significant dehydration.

## 2022-01-20 NOTE — Telephone Encounter (Signed)
Called pt and verified DOB, advised Alyssa recommendations of advised to contact front office and make appt with PCP [name]) in the next few weeks. Also write down BP readings as she monitors those for them to discuss. Pt verbalized understanding

## 2022-01-20 NOTE — Telephone Encounter (Signed)
Please see phone note from patient this morning and advise

## 2022-01-20 NOTE — Telephone Encounter (Signed)
Pt states she was instructed to "contact" PCP by AA after going to the ED.  Pt has declined an OV at this time.   Pt states ED provider recommended to not take BP meds at present or lower dosage after hydration.    Pt states 06/20 morning BP: 122/86 with no meds.   Pt requests call back for next steps.  Please route to [place] for any scheduling needs.

## 2022-01-29 ENCOUNTER — Telehealth: Payer: Self-pay | Admitting: *Deleted

## 2022-01-29 ENCOUNTER — Other Ambulatory Visit: Payer: Self-pay | Admitting: Family Medicine

## 2022-01-29 ENCOUNTER — Ambulatory Visit (INDEPENDENT_AMBULATORY_CARE_PROVIDER_SITE_OTHER): Payer: 59

## 2022-01-29 ENCOUNTER — Ambulatory Visit: Payer: 59 | Admitting: Obstetrics & Gynecology

## 2022-01-29 DIAGNOSIS — N840 Polyp of corpus uteri: Secondary | ICD-10-CM | POA: Diagnosis not present

## 2022-01-29 DIAGNOSIS — N950 Postmenopausal bleeding: Secondary | ICD-10-CM

## 2022-01-29 DIAGNOSIS — R9389 Abnormal findings on diagnostic imaging of other specified body structures: Secondary | ICD-10-CM

## 2022-01-29 NOTE — Progress Notes (Signed)
Karen Webster 16-Feb-1961 [id number]        61 y.o.  G1P1L1  RP: PMB/Endometrial Polyp for Sonohysto  HPI: Postmenopause on HRT with Estradiol patch weekly decreased from 0.05 to 0.0375 on 11/13/2021 visit, and Prometrium 100 mg at bedtime. Had PMB in Feb and March 2023. Then had minimal occasional spotting.  Not bleeding currently.  No pelvic pain, occasional pressure at the Rt lower abdomen.  No post coital bleeding.  C/O SUI.  No urgency.  No fever.  Pelvic US 12/09/21 showed an endometrial line at 5 mm with a probable Polyp.    OB History  Gravida Para Term Preterm AB Living  '1 1       1  '$ SAB IAB Ectopic Multiple Live Births               # Outcome Date GA Lbr Len/2nd Weight Sex Delivery Anes PTL Lv  1 Para             Past medical history,surgical history, problem list, medications, allergies, family history and social history were all reviewed and documented in the EPIC chart.   Directed ROS with pertinent positives and negatives documented in the history of present illness/assessment and plan.  Exam:  There were no vitals filed for this visit. General appearance:  Normal                                                                    Sono Infusion Hysterogram ( procedure note)   The initial transvaginal ultrasound demonstrated the following:  Limited vaginal ultrasound.  Retroverted uterus with multiple large fibroids again seen today.  The endometrial lining is thin, symmetrical measured at approximately 3.8 mm.  The cavity is partially distorted by adjacent larger fibroids.  Both ovaries are atrophic in appearance and normal.  Positive perfusion to both ovaries.  No adnexal mass.  No free fluid in the pelvis.   The speculum  was inserted and the cervix cleansed with Betadine solution after confirming that patient has no allergies.A small sonohysterography catheterwas utilized.  Insertion was facilitated with ring forceps, using a spear-like motion the  catheter was inserted to the fundus of the uterus. The speculum is then removed carefully to avoid dislodging the catheter. The catheter was flushed with sterile saline delete prior to insertion to rid it of small amounts of air.the sterile saline solution was infused into the uterine cavity as a vaginal ultrasound probe was then placed in the vagina for full visualization of the uterine cavity from a transvaginal approach. The following was noted:  Sonohysterogram performed with saline injection.  A 0.8 x 0.4 cm intracavitary mass identified in the right lateral fundal aspect of the endometrial canal with prominent feeder vessel, probable small endometrial polyp.  The catheter was then removed after retrieving some of the saline from the intrauterine cavity. An endometrial biopsy not done. Patient tolerated procedure well. She had received a tablet of Aleve for discomfort.     Assessment/Plan:  61 y.o. G1P1   1. Postmenopausal bleeding Postmenopause on HRT with Estradiol patch weekly decreased from 0.05 to 0.0375 on 11/13/2021 visit, and Prometrium 100 mg at bedtime. Had PMB in Feb and March 2023. Then had minimal occasional spotting.  Not  bleeding currently.  No pelvic pain, occasional pressure at the Rt lower abdomen.  No post coital bleeding.  C/O SUI.  No urgency.  No fever.  Pelvic US 12/09/21 showed an endometrial line at 5 mm with a probable Polyp.  Sonohysto today confirms an Endometrial Polyp.  Will proceed with a HSC/Myosure Excision/D+C.  Pamphlet given.  F/U Preop.   2. Thickened endometrium Endometrium thin on Sonohysto.  3. Endometrial polyp  Small Endometrial Polyp present.  Princess Bruins MD, 10:39 AM 01/29/2022

## 2022-01-29 NOTE — Telephone Encounter (Signed)
-----   Message from Princess Bruins, MD sent at 01/29/2022 11:05 AM EDT ----- Regarding: Schedule surgery Surgery: CPT 83015 - Hysteroscopy/D&C/Myosure  Diagnosis: R93.89 Thickened Endometrium, N84.0 Endometrial Polyp, N95.0 Postmenopausal Bleeding  Location: [facility]  Status: Outpatient  Time: 30 Minutes  Assistant: N/A  Urgency: First Available  Pre-Op Appointment: To Be Scheduled  Post-Op Appointment(s): 2 Weeks  Time Out Of Work: Day Of Surgery ONLY

## 2022-01-29 NOTE — Telephone Encounter (Signed)
Call to patient, no answer, no option to leave voicemail.

## 2022-01-30 ENCOUNTER — Encounter: Payer: Self-pay | Admitting: Obstetrics & Gynecology

## 2022-02-18 NOTE — Telephone Encounter (Signed)
Spoke with patient. Reviewed surgery dates. Patient request to proceed with surgery on 03/24/22. Patient declined earlier dates.   Advised patient I will forward to business office for return call. I will return call once surgery date and time confirmed. Patient verbalizes understanding and is agreeable.   Surgery request sent.   Routing to [employer]

## 2022-02-24 NOTE — Telephone Encounter (Signed)
Spoke with patient. Surgery date request confirmed.  Advised surgery is scheduled for 03/24/22, [facility] at 1130.  Surgery instruction sheet and hospital brochure reviewed, printed copy will be mailed.  Patient verbalizes understanding and is agreeable.  Routing to [employer] for benefits.

## 2022-02-24 NOTE — Telephone Encounter (Signed)
Spoke with patient regarding surgery benefits. Patient acknowledges understanding of information presented. Patient is aware that benefits presented are professional benefits only. Patient is aware the hospital will call with facility benefits. See account note.Front desk aware of surgery cost  Routing to Glorianne Manchester, Therapist, sports.

## 2022-02-25 ENCOUNTER — Encounter: Payer: Self-pay | Admitting: Family Medicine

## 2022-02-25 ENCOUNTER — Other Ambulatory Visit: Payer: Self-pay | Admitting: *Deleted

## 2022-02-25 MED ORDER — TIRZEPATIDE 15 MG/0.5ML ~~LOC~~ SOAJ: 15.0000 mg | SUBCUTANEOUS | 0 refills | Status: DC

## 2022-03-02 ENCOUNTER — Ambulatory Visit (INDEPENDENT_AMBULATORY_CARE_PROVIDER_SITE_OTHER): Payer: 59 | Admitting: Obstetrics & Gynecology

## 2022-03-02 ENCOUNTER — Encounter: Payer: Self-pay | Admitting: Obstetrics & Gynecology

## 2022-03-02 VITALS — BP 118/80 | Ht 65.0 in | Wt 212.0 lb

## 2022-03-02 DIAGNOSIS — R300 Dysuria: Secondary | ICD-10-CM

## 2022-03-02 DIAGNOSIS — N840 Polyp of corpus uteri: Secondary | ICD-10-CM

## 2022-03-02 DIAGNOSIS — N950 Postmenopausal bleeding: Secondary | ICD-10-CM | POA: Diagnosis not present

## 2022-03-02 MED ORDER — CIPROFLOXACIN HCL 500 MG PO TABS: 500.0000 mg | ORAL_TABLET | Freq: Two times a day (BID) | ORAL | 0 refills | Status: AC

## 2022-03-02 NOTE — Progress Notes (Signed)
Karen Webster 1960/11/08 [id number]        61 y.o.  G1P1L1  RP: Preop HSC/Myosure Excision/D+C for Endometrial Polyp  HPI: PMB on HRT.  Endometrial Polyp per Sonohysto.  No pelvic pain.  C/O burning with urination, urine odor and cloudiness.  No fever.   OB History  Gravida Para Term Preterm AB Living  '1 1       1  '$ SAB IAB Ectopic Multiple Live Births               # Outcome Date GA Lbr Len/2nd Weight Sex Delivery Anes PTL Lv  1 Para             Past medical history,surgical history, problem list, medications, allergies, family history and social history were all reviewed and documented in the EPIC chart.   Directed ROS with pertinent positives and negatives documented in the history of present illness/assessment and plan.  Exam:  Vitals:   03/02/22 1628  BP: 118/80  Weight: 212 lb (96.2 kg)  Height: '5\' 5"'$  (1.651 m)   General appearance:  Normal  Sonohysto 01/29/22: Sono Infusion Hysterogram ( procedure note)     The initial transvaginal ultrasound demonstrated the following:   Limited vaginal ultrasound.  Retroverted uterus with multiple large fibroids again seen today.  The endometrial lining is thin, symmetrical measured at approximately 3.8 mm.  The cavity is partially distorted by adjacent larger fibroids.  Both ovaries are atrophic in appearance and normal.  Positive perfusion to both ovaries.  No adnexal mass.  No free fluid in the pelvis.     The speculum  was inserted and the cervix cleansed with Betadine solution after confirming that patient has no allergies.A small sonohysterography catheterwas utilized.  Insertion was facilitated with ring forceps, using a spear-like motion the catheter was inserted to the fundus of the uterus. The speculum is then removed carefully to avoid dislodging the catheter. The catheter was flushed with sterile saline delete prior to insertion to rid it of small amounts of air.the sterile saline solution was infused into  the uterine cavity as a vaginal ultrasound probe was then placed in the vagina for full visualization of the uterine cavity from a transvaginal approach. The following was noted:   Sonohysterogram performed with saline injection.  A 0.8 x 0.4 cm intracavitary mass identified in the right lateral fundal aspect of the endometrial canal with prominent feeder vessel, probable small endometrial polyp.   The catheter was then removed after retrieving some of the saline from the intrauterine cavity. An endometrial biopsy not done. Patient tolerated procedure well. She had received a tablet of Aleve for discomfort.      U/A: Yellow cloudy, Pro Neg, Nit Positive, WBC 6-10, RBC Neg, Bacteria Many.  U. Culture pending.   Assessment/Plan:  61 y.o. G1P1   1. Postmenopausal bleeding  PMB on HRT.  Endometrial Polyp per Sonohysto.  No pelvic pain. Sonohysterogram performed with saline injection.  A 0.8 x 0.4 cm intracavitary mass identified in the right lateral fundal aspect of the endometrial canal with prominent feeder vessel, probable small endometrial polyp.  Decision to proceed with HSC/Myosure Excision/D+C.  Preop preparation, surgery with risks, including the risk of perforation of the uterus, and postop expectations and precautions thoroughly reviewed.  Patient voiced understanding and agreement.    2. Endometrial polyp Endometrial Polyp 8 mm x 4 mm.  3. Dysuria  C/O burning with urination, urine odor and cloudiness.  No fever.  U/A abnormal.  Probable acute cystitis.  Decision to treat with Cipro 500 mg PO BID x 7 days.  Pending urine culture. - Urinalysis,Complete w/RFL Culture  Other orders - Cyanocobalamin (VITAMIN B 12 PO); Take by mouth. - ciprofloxacin (CIPRO) 500 MG tablet; Take 1 tablet (500 mg total) by mouth 2 (two) times daily for 7 days. - Urine Culture - REFLEXIVE URINE CULTURE                         Patient was counseled as to the risk of surgery to include the following:  1.  Infection (prohylactic antibiotics will be administered)  2. DVT/Pulmonary Embolism (prophylactic pneumo compression stockings will be used)  3.Trauma to internal organs requiring additional surgical procedure to repair any injury to internal organs requiring perhaps additional hospitalization days.  4.Hemmorhage requiring transfusion and blood products which carry risks such as anaphylactic reaction, hepatitis and AIDS  Patient had received literature information on the procedure scheduled and all her questions were answered and fully accepts all risk.    Princess Bruins MD, 4:57 PM 03/02/2022

## 2022-03-05 LAB — URINALYSIS, COMPLETE W/RFL CULTURE
Bilirubin Urine: NEGATIVE
Glucose, UA: NEGATIVE
Hgb urine dipstick: NEGATIVE
Hyaline Cast: NONE SEEN /LPF
Ketones, ur: NEGATIVE
Nitrites, Initial: POSITIVE — AB
Protein, ur: NEGATIVE
RBC / HPF: NONE SEEN /HPF (ref 0–2)
Specific Gravity, Urine: 1.025 (ref 1.001–1.035)
pH: 6 (ref 5.0–8.0)

## 2022-03-05 LAB — CULTURE INDICATED

## 2022-03-05 LAB — URINE CULTURE
MICRO NUMBER:: 13714683
SPECIMEN QUALITY:: ADEQUATE

## 2022-03-13 ENCOUNTER — Other Ambulatory Visit: Payer: Self-pay

## 2022-03-13 ENCOUNTER — Encounter (HOSPITAL_BASED_OUTPATIENT_CLINIC_OR_DEPARTMENT_OTHER): Payer: Self-pay | Admitting: Obstetrics & Gynecology

## 2022-03-13 NOTE — Progress Notes (Signed)
Spoke w/ via phone for pre-op interview---pt Lab needs dos----CBC, I-stat             Lab results------EKG 01/21/2022 COVID test -----patient states asymptomatic no test needed Arrive at -------0945 NPO after MN NO Solid Food.  Clear liquids from MN until---0845 Med rec completed Medications to take morning of surgery -----Prilosec  Diabetic medication -----n/a Patient instructed no nail polish to be worn day of surgery Patient instructed to bring photo id and insurance card day of surgery Patient aware to have Driver (ride ) / caregiver Karen Webster or Karen Webster   for 24 hours after surgery  Patient Special Instructions -----none Pre-Op special Istructions -----none Patient verbalized understanding of instructions that were given at this phone interview. Patient denies shortness of breath, chest pain, fever, cough at this phone interview.

## 2022-03-22 ENCOUNTER — Other Ambulatory Visit: Payer: Self-pay | Admitting: Obstetrics & Gynecology

## 2022-03-22 DIAGNOSIS — N951 Menopausal and female climacteric states: Secondary | ICD-10-CM

## 2022-03-23 NOTE — Telephone Encounter (Signed)
Med refill request: Vivelle Dot 0.05 mg patch Last AEX: 11/13/21 -ML Next AEX: Not scheduled Last MMG (if hormonal med) 10/23/21, [facility] 2, Benign findings  Current Rx on file for Vivelle Dot 0.'0375mg'$  patch.   Call placed to patient, left detailed message, ok per dpr. Advised patient to return call to office to confirm which dosage patch she is currently taking.

## 2022-03-23 NOTE — Telephone Encounter (Addendum)
Patient called back. She is on the Vivelle Dot 0.0375 mg patch and she has plenty. Asked that we "ignore" this request. Refill denied with pharmacy for the 0.05.

## 2022-03-24 ENCOUNTER — Ambulatory Visit (HOSPITAL_BASED_OUTPATIENT_CLINIC_OR_DEPARTMENT_OTHER)
Admission: RE | Admit: 2022-03-24 | Discharge: 2022-03-24 | Disposition: A | Discharge status: Home / Self Care | Payer: 59 | Source: Ambulatory Visit | Attending: Obstetrics & Gynecology | Admitting: Obstetrics & Gynecology

## 2022-03-24 ENCOUNTER — Encounter (HOSPITAL_BASED_OUTPATIENT_CLINIC_OR_DEPARTMENT_OTHER)
Admission: RE | Disposition: A | Discharge status: Home / Self Care | Payer: Self-pay | Source: Ambulatory Visit | Attending: Obstetrics & Gynecology

## 2022-03-24 ENCOUNTER — Ambulatory Visit: Payer: 59 | Admitting: Physician Assistant

## 2022-03-24 ENCOUNTER — Encounter: Payer: Self-pay | Admitting: Physician Assistant

## 2022-03-24 ENCOUNTER — Ambulatory Visit (HOSPITAL_BASED_OUTPATIENT_CLINIC_OR_DEPARTMENT_OTHER): Payer: 59 | Admitting: Anesthesiology

## 2022-03-24 ENCOUNTER — Other Ambulatory Visit: Payer: Self-pay

## 2022-03-24 ENCOUNTER — Encounter (HOSPITAL_BASED_OUTPATIENT_CLINIC_OR_DEPARTMENT_OTHER): Payer: Self-pay | Admitting: Obstetrics & Gynecology

## 2022-03-24 VITALS — BP 118/70 | HR 94 | Temp 98.2°F | Ht 66.0 in | Wt 208.0 lb

## 2022-03-24 DIAGNOSIS — E876 Hypokalemia: Secondary | ICD-10-CM

## 2022-03-24 DIAGNOSIS — I10 Essential (primary) hypertension: Secondary | ICD-10-CM

## 2022-03-24 DIAGNOSIS — Z79890 Hormone replacement therapy (postmenopausal): Secondary | ICD-10-CM | POA: Diagnosis not present

## 2022-03-24 DIAGNOSIS — N840 Polyp of corpus uteri: Secondary | ICD-10-CM | POA: Diagnosis not present

## 2022-03-24 DIAGNOSIS — Z01818 Encounter for other preprocedural examination: Secondary | ICD-10-CM

## 2022-03-24 DIAGNOSIS — R9389 Abnormal findings on diagnostic imaging of other specified body structures: Secondary | ICD-10-CM | POA: Insufficient documentation

## 2022-03-24 DIAGNOSIS — N950 Postmenopausal bleeding: Secondary | ICD-10-CM | POA: Insufficient documentation

## 2022-03-24 HISTORY — PX: DILATATION & CURETTAGE/HYSTEROSCOPY WITH MYOSURE: SHX6511

## 2022-03-24 LAB — POCT I-STAT, CHEM 8
BUN: 15 mg/dL (ref 8–23)
Calcium, Ion: 1.16 mmol/L (ref 1.15–1.40)
Chloride: 99 mmol/L (ref 98–111)
Creatinine, Ser: 0.8 mg/dL (ref 0.44–1.00)
Glucose, Bld: 96 mg/dL (ref 70–99)
HCT: 40 % (ref 36.0–46.0)
Hemoglobin: 13.6 g/dL (ref 12.0–15.0)
Potassium: 3 mmol/L — ABNORMAL LOW (ref 3.5–5.1)
Sodium: 139 mmol/L (ref 135–145)
TCO2: 24 mmol/L (ref 22–32)

## 2022-03-24 LAB — CBC
HCT: 41 % (ref 36.0–46.0)
Hemoglobin: 13.8 g/dL (ref 12.0–15.0)
MCH: 29.7 pg (ref 26.0–34.0)
MCHC: 33.7 g/dL (ref 30.0–36.0)
MCV: 88.4 fL (ref 80.0–100.0)
Platelets: 312 10*3/uL (ref 150–400)
RBC: 4.64 MIL/uL (ref 3.87–5.11)
RDW: 12.2 % (ref 11.5–15.5)
WBC: 6.1 10*3/uL (ref 4.0–10.5)
nRBC: 0 % (ref 0.0–0.2)

## 2022-03-24 SURGERY — DILATATION & CURETTAGE/HYSTEROSCOPY WITH MYOSURE
Anesthesia: General | Site: Vagina

## 2022-03-24 MED ORDER — LIDOCAINE HCL 1 % IJ SOLN
INTRAMUSCULAR | Status: DC | PRN
  Administered 2022-03-24: 10 mL

## 2022-03-24 MED ORDER — FENTANYL CITRATE (PF) 100 MCG/2ML IJ SOLN
INTRAMUSCULAR | Status: AC
  Filled 2022-03-24: qty 2

## 2022-03-24 MED ORDER — KETOROLAC TROMETHAMINE 30 MG/ML IJ SOLN
30.0000 mg | Freq: Once | INTRAMUSCULAR | Status: DC | PRN
Start: 2022-03-24 — End: 2022-03-24

## 2022-03-24 MED ORDER — KETOROLAC TROMETHAMINE 30 MG/ML IJ SOLN
INTRAMUSCULAR | Status: AC
  Filled 2022-03-24: qty 3

## 2022-03-24 MED ORDER — MIDAZOLAM HCL 2 MG/2ML IJ SOLN
INTRAMUSCULAR | Status: AC
  Filled 2022-03-24: qty 2

## 2022-03-24 MED ORDER — HYDROMORPHONE HCL 1 MG/ML IJ SOLN: 0.2500 mg | INTRAMUSCULAR | Status: DC | PRN

## 2022-03-24 MED ORDER — ACETAMINOPHEN 500 MG PO TABS
1000.0000 mg | ORAL_TABLET | Freq: Once | ORAL | Status: AC
Start: 2022-03-24 — End: 2022-03-24
  Administered 2022-03-24: 1000 mg via ORAL

## 2022-03-24 MED ORDER — FENTANYL CITRATE (PF) 250 MCG/5ML IJ SOLN
INTRAMUSCULAR | Status: DC | PRN
  Administered 2022-03-24: 100 ug via INTRAVENOUS

## 2022-03-24 MED ORDER — AMISULPRIDE (ANTIEMETIC) 5 MG/2ML IV SOLN
10.0000 mg | Freq: Once | INTRAVENOUS | Status: DC | PRN
Start: 2022-03-24 — End: 2022-03-24

## 2022-03-24 MED ORDER — CEFAZOLIN SODIUM-DEXTROSE 2-4 GM/100ML-% IV SOLN
INTRAVENOUS | Status: AC
  Filled 2022-03-24: qty 100

## 2022-03-24 MED ORDER — ONDANSETRON HCL 4 MG/2ML IJ SOLN
INTRAMUSCULAR | Status: DC | PRN
  Administered 2022-03-24: 4 mg via INTRAVENOUS

## 2022-03-24 MED ORDER — LIDOCAINE 2% (20 MG/ML) 5 ML SYRINGE
INTRAMUSCULAR | Status: DC | PRN
  Administered 2022-03-24: 60 mg via INTRAVENOUS

## 2022-03-24 MED ORDER — ONDANSETRON HCL 4 MG/2ML IJ SOLN: 4.0000 mg | Freq: Once | INTRAMUSCULAR | Status: DC | PRN

## 2022-03-24 MED ORDER — CEFAZOLIN SODIUM-DEXTROSE 2-4 GM/100ML-% IV SOLN
2.0000 g | INTRAVENOUS | Status: AC
Start: 2022-03-24 — End: 2022-03-24
  Administered 2022-03-24: 2 g via INTRAVENOUS

## 2022-03-24 MED ORDER — KETOROLAC TROMETHAMINE 15 MG/ML IJ SOLN
INTRAMUSCULAR | Status: DC | PRN
  Administered 2022-03-24: 15 mg via INTRAVENOUS

## 2022-03-24 MED ORDER — DEXAMETHASONE SODIUM PHOSPHATE 10 MG/ML IJ SOLN
INTRAMUSCULAR | Status: DC | PRN
  Administered 2022-03-24: 10 mg via INTRAVENOUS

## 2022-03-24 MED ORDER — OXYCODONE HCL 5 MG/5ML PO SOLN: 5.0000 mg | Freq: Once | ORAL | Status: DC | PRN

## 2022-03-24 MED ORDER — LACTATED RINGERS IV SOLN
INTRAVENOUS | Status: DC
Start: 2022-03-24 — End: 2022-03-24

## 2022-03-24 MED ORDER — LACTATED RINGERS IV SOLN: INTRAVENOUS | Status: DC | PRN

## 2022-03-24 MED ORDER — LACTATED RINGERS IV SOLN: INTRAVENOUS | Status: DC

## 2022-03-24 MED ORDER — ACETAMINOPHEN 500 MG PO TABS
ORAL_TABLET | ORAL | Status: AC
  Filled 2022-03-24: qty 2

## 2022-03-24 MED ORDER — LIDOCAINE HCL (PF) 2 % IJ SOLN
INTRAMUSCULAR | Status: AC
  Filled 2022-03-24: qty 5

## 2022-03-24 MED ORDER — OXYCODONE HCL 5 MG PO TABS: 5.0000 mg | ORAL_TABLET | Freq: Once | ORAL | Status: DC | PRN

## 2022-03-24 MED ORDER — POVIDONE-IODINE 10 % EX SWAB: 2.0000 | Freq: Once | CUTANEOUS | Status: DC

## 2022-03-24 MED ORDER — MIDAZOLAM HCL 2 MG/2ML IJ SOLN
INTRAMUSCULAR | Status: DC | PRN
  Administered 2022-03-24: 2 mg via INTRAVENOUS

## 2022-03-24 MED ORDER — PROPOFOL 10 MG/ML IV BOLUS
INTRAVENOUS | Status: AC
  Filled 2022-03-24: qty 20

## 2022-03-24 MED ORDER — PROPOFOL 10 MG/ML IV BOLUS
INTRAVENOUS | Status: DC | PRN
  Administered 2022-03-24: 50 mg via INTRAVENOUS
  Administered 2022-03-24: 150 mg via INTRAVENOUS

## 2022-03-24 MED ORDER — SODIUM CHLORIDE 0.9 % IR SOLN
Status: DC | PRN
  Administered 2022-03-24: 3000 mL

## 2022-03-24 SURGICAL SUPPLY — 15 items
CATH ROBINSON RED A/P 16FR (CATHETERS) ×1 IMPLANT
DEVICE MYOSURE REACH (MISCELLANEOUS) IMPLANT
GLOVE BIO SURGEON STRL SZ 6.5 (GLOVE) ×1 IMPLANT
GLOVE BIOGEL PI IND STRL 7.0 (GLOVE) ×2 IMPLANT
GLOVE BIOGEL PI INDICATOR 7.0 (GLOVE) ×3
GLOVE ECLIPSE 6.0 STRL STRAW (GLOVE) IMPLANT
GOWN STRL REUS W/TWL LRG LVL3 (GOWN DISPOSABLE) ×2 IMPLANT
IV NS IRRIG 3000ML ARTHROMATIC (IV SOLUTION) ×1 IMPLANT
KIT PROCEDURE FLUENT (KITS) ×1 IMPLANT
KIT TURNOVER CYSTO (KITS) ×1 IMPLANT
PACK VAGINAL MINOR WOMEN LF (CUSTOM PROCEDURE TRAY) ×1 IMPLANT
PAD OB MATERNITY 4.3X12.25 (PERSONAL CARE ITEMS) ×1 IMPLANT
PAD PREP 24X48 CUFFED NSTRL (MISCELLANEOUS) ×1 IMPLANT
SEAL ROD LENS SCOPE MYOSURE (ABLATOR) ×1 IMPLANT
TOWEL OR 17X26 10 PK STRL BLUE (TOWEL DISPOSABLE) ×1 IMPLANT

## 2022-03-24 NOTE — Anesthesia Preprocedure Evaluation (Addendum)
Anesthesia Evaluation  Patient identified by MRN, date of birth, ID band Patient awake    Reviewed: Allergy & Precautions, NPO status , Patient's Chart, lab work & pertinent test results  Airway Mallampati: III  TM Distance: >3 FB Neck ROM: Full    Dental  (+) Teeth Intact, Dental Advisory Given   Pulmonary neg pulmonary ROS,    Pulmonary exam normal breath sounds clear to auscultation       Cardiovascular hypertension, Pt. on medications Normal cardiovascular exam Rhythm:Regular Rate:Normal     Neuro/Psych PSYCHIATRIC DISORDERS Depression negative neurological ROS     GI/Hepatic Neg liver ROS, GERD  Medicated and Controlled,  Endo/Other  Obesity BMI 34  Renal/GU negative Renal ROS  Female GU complaint     Musculoskeletal  (+) Arthritis , Osteoarthritis,    Abdominal (+) + obese,   Peds  Hematology negative hematology ROS (+)   Anesthesia Other Findings   Reproductive/Obstetrics negative OB ROS                            Anesthesia Physical Anesthesia Plan  ASA: 2  Anesthesia Plan: General   Post-op Pain Management: Tylenol PO (pre-op)* and Toradol IV (intra-op)*   Induction: Intravenous  PONV Risk Score and Plan: 4 or greater and Ondansetron, Dexamethasone, Midazolam and Treatment may vary due to age or medical condition  Airway Management Planned: LMA  Additional Equipment: None  Intra-op Plan:   Post-operative Plan: Extubation in OR  Informed Consent: I have reviewed the patients History and Physical, chart, labs and discussed the procedure including the risks, benefits and alternatives for the proposed anesthesia with the patient or authorized representative who has indicated his/her understanding and acceptance.     Dental advisory given  Plan Discussed with: CRNA  Anesthesia Plan Comments:       Anesthesia Quick Evaluation

## 2022-03-24 NOTE — Progress Notes (Addendum)
Karen Webster is a 61 y.o. female here for a follow up of a pre-existing problem.  History of Present Illness:   Chief Complaint  Patient presents with   Discuss Potassium    HPI  Hypokalemia She had a D&C and hysteroscopy with MyoSure performed this morning by Dr. Omelia Blackwater.  She had blood work done prior to her procedure and the anesthesiologist told her that her potassium was borderline too low to have her procedure.  This has the patient quite worried.  She denies any: Diarrhea, vomiting, use of supplements, inhaler use, lack of compliance with BP medications, fatigue, palpitations, chest pain, shortness of breath.  She is currently taking triamterene-hydrochlorothiazide 37.5-25 mg capsule daily.  If she does not take this medication she gets swelling in her lower legs.  Past Medical History:  Diagnosis Date   Arthritis    knees   GERD (gastroesophageal reflux disease)    Hypertension    Seasonal allergies    SUI (stress urinary incontinence, female)      Social History   Tobacco Use   Smoking status: Never   Smokeless tobacco: Never  Vaping Use   Vaping Use: Never used  Substance Use Topics   Alcohol use: Yes    Comment: SOCIAL    Drug use: Never    Past Surgical History:  Procedure Laterality Date   CATARACT EXTRACTION, BILATERAL     ~12 yrs ago   CHOLECYSTECTOMY     ~31 yrs ago   COLONOSCOPY  2018   HYSTEROSCOPY N/A 11/17/2017   Procedure: HYSTEROSCOPY FOR REMOVAL OF IUD;  Surgeon: Princess Bruins, MD;  Location: [facility];  Service: Gynecology;  Laterality: N/A;   KNEE SURGERY     Right knee, Dr Reynaldo Minium   NASAL SEPTUM SURGERY     ~35 yrs ago   Organ     Late 70s per pt    Family History  Problem Relation Age of Onset   Dementia Mother    Hypertension Mother    Hypertension Father    Atrial fibrillation Father    Heart attack Brother    Dementia Maternal Grandmother    Heart disease Maternal Grandfather     Breast cancer Paternal Grandmother    Cancer Paternal Grandmother    Heart disease Paternal Grandfather     Allergies  Allergen Reactions   Nitrofurantoin Hives and Rash   Macrodantin [Nitrofurantoin Macrocrystal] Hives   Tape     "Makes skin raw" - paper tape better   Tetracycline Hives   Tetracyclines & Related Hives   Bactrim [Sulfamethoxazole-Trimethoprim] Rash    Current Medications:   Current Outpatient Medications:    acetaminophen (TYLENOL) 650 MG CR tablet, Take 1,300 mg by mouth every 8 (eight) hours as needed for pain., Disp: , Rfl:    Cholecalciferol (VITAMIN D3 PO), Take 1,000 Units by mouth daily., Disp: , Rfl:    Cyanocobalamin (B-12 PO), Take 1 tablet by mouth daily., Disp: , Rfl:    ibuprofen (ADVIL) 200 MG tablet, Take 800 mg by mouth every 6 (six) hours as needed for moderate pain., Disp: , Rfl:    omeprazole (PRILOSEC) 20 MG capsule, TAKE ONE CAPSULE BY MOUTH DAILY (Patient taking differently: Take 20 mg by mouth daily.), Disp: 90 capsule, Rfl: 3   tirzepatide (MOUNJARO) 15 MG/0.5ML Pen, Inject 15 mg into the skin once a week. (Patient taking differently: Inject 15 mg into the skin once a week. Wednesday morning, For weight loss), Disp:  6 mL, Rfl: 0   triamterene-hydrochlorothiazide (DYAZIDE) 37.5-25 MG capsule, Take 1 each (1 capsule total) by mouth daily. (Patient taking differently: Take 1 capsule by mouth daily. In am), Disp: 90 capsule, Rfl: 3   estradiol (VIVELLE-DOT) 0.05 MG/24HR patch, PLACE 1 PATCH ONTO THE SKIN 2 TIMES A WEEK. (Patient not taking: Reported on 03/24/2022), Disp: , Rfl:    Review of Systems:   ROS Negative unless otherwise specified per HPI.  Vitals:   Vitals:   03/24/22 1547  BP: 118/70  Pulse: 94  Temp: 98.2 F (36.8 C)  TempSrc: Temporal  SpO2: 95%  Weight: 208 lb (94.3 kg)  Height: '5\' 6"'$  (1.676 m)     Body mass index is 33.57 kg/m.  Physical Exam:   Physical Exam Vitals and nursing note reviewed.  Constitutional:       General: She is not in acute distress.    Appearance: She is well-developed. She is not ill-appearing or toxic-appearing.  Cardiovascular:     Rate and Rhythm: Normal rate and regular rhythm.     Pulses: Normal pulses.     Heart sounds: Normal heart sounds, S1 normal and S2 normal.  Pulmonary:     Effort: Pulmonary effort is normal.     Breath sounds: Normal breath sounds.  Skin:    General: Skin is warm and dry.  Neurological:     Mental Status: She is alert.     GCS: GCS eye subscore is 4. GCS verbal subscore is 5. GCS motor subscore is 6.  Psychiatric:        Speech: Speech normal.        Behavior: Behavior normal. Behavior is cooperative.     Assessment and Plan:   Hypokalemia New issue for patient I discussed with her that her current potassium level is not in a critical level and that we will recheck it before we decide any sort of intervention I will be in touch with her results and plan after blood work has returned I also wrote her out for the day tomorrow so she can recover from her procedure  Inda Coke, PA-C

## 2022-03-24 NOTE — Discharge Instructions (Signed)
  Post Anesthesia Home Care Instructions  Activity: Get plenty of rest for the remainder of the day. A responsible individual must stay with you for 24 hours following the procedure.  For the next 24 hours, DO NOT: -Drive a car -Paediatric nurse -Drink alcoholic beverages -Take any medication unless instructed by your physician -Make any legal decisions or sign important papers.  Meals: Start with liquid foods such as gelatin or soup. Progress to regular foods as tolerated. Avoid greasy, spicy, heavy foods. If nausea and/or vomiting occur, drink only clear liquids until the nausea and/or vomiting subsides. Call your physician if vomiting continues.  Special Instructions/Symptoms: Your throat may feel dry or sore from the anesthesia or the breathing tube placed in your throat during surgery. If this causes discomfort, gargle with warm salt water. The discomfort should disappear within 24 hours.  DISCHARGE INSTRUCTIONS: HYSTEROSCOPY  The following instructions have been prepared to help you care for yourself upon your return home.  May take Ibuprofen after 6 PM.  May take stool softner while taking narcotic pain medication to prevent constipation.  Drink plenty of water.  Personal hygiene:  Use sanitary pads for vaginal drainage, not tampons.  Shower the day after your procedure.  NO tub baths, pools or Jacuzzis for 2-3 weeks.  Wipe front to back after using the bathroom.  Activity and limitations:  Do NOT drive or operate any equipment for 24 hours. The effects of anesthesia are still present and drowsiness may result.  Do NOT rest in bed all day.  Walking is encouraged.  Walk up and down stairs slowly.  You may resume your normal activity in one to two days or as indicated by your physician. Sexual activity: NO intercourse for at least 2 weeks after the procedure, or as indicated by your Doctor.  Diet: Eat a light meal as desired this evening. You may resume your usual diet  tomorrow.  Return to Work: You may resume your work activities in one to two days or as indicated by Marine scientist.  What to expect after your surgery: Expect to have vaginal bleeding/discharge for 2-3 days and spotting for up to 10 days. It is not unusual to have soreness for up to 1-2 weeks. You may have a slight burning sensation when you urinate for the first day. Mild cramps may continue for a couple of days. You may have a regular period in 2-6 weeks.  Call your doctor for any of the following:  Excessive vaginal bleeding or clotting, saturating and changing one pad every hour.  Inability to urinate 6 hours after discharge from hospital.  Pain not relieved by pain medication.  Fever of 100.4 F or greater.  Unusual vaginal discharge or odor.

## 2022-03-24 NOTE — Patient Instructions (Signed)
It was great to see you!  Recheck your potassium today and I will be in touch with the plan.  Take the day off tomorrow and REST!!!!!  Karen Webster

## 2022-03-24 NOTE — Op Note (Signed)
Operative Note  03/24/2022  12:05 PM  PATIENT:  Karen Webster  61 y.o. female  PRE-OPERATIVE DIAGNOSIS:  Thickened endometrium, endometrial polyp, postmenopausal bleeding  POST-OPERATIVE DIAGNOSIS:  Thickened endometrium, endometrial polyp, postmenopausal bleeding  PROCEDURE:  Procedure(s): DILATATION & CURETTAGE/HYSTEROSCOPY WITH MYOSURE EXCISION  SURGEON:  Surgeon(s): Princess Bruins, MD  ANESTHESIA:   general  FINDINGS: Right fundal endometrial polyp.  2 areas of mildly thickened endometrium.  Bilateral ostia visualized.  DESCRIPTION OF OPERATION: Under general anesthesia with the laryngeal mask, the patient is in lithotomy position.  She is prepped with Betadine on the suprapubic, vulvar and vaginal areas.  The bladder is catheterized.  The patient is draped as usual.  Timeout is done.  The vaginal exam reveals a retroverted uterus mildly increased in size with fibroids and mobile.  No adnexal mass.  The speculum is inserted in the vagina and the anterior lip of the cervix is grasped with a tenaculum.  Dilation of the cervix with [facility] dilators.  Insertion of the hysteroscope in the intra uterine cavity.  Inspection of the intrauterine cavity.  Both ostia are well visualized.  A small fundal polyp is present on the right about 1 cm.  2 small areas of mildly thickened endometrium are present at the left posterior cavity and right anterior mid cavity.  No evidence of increased vascularity.  Pictures were taken before excision and after.  We used the reach MyoSure to excise the polyp completely and both areas of mild thickening of the endometrium.  Hemostasis was adequate.  The hysteroscope with MyoSure are then removed.  We proceeded with a systematic curettage of the intra uterine cavity on all surfaces with a small sharp curette.  All specimens are sent together to pathology.  The curette tenaculum and speculum are removed.  Hemostasis was adequate.  The patient was brought to  recovery room in good and stable status.  ESTIMATED BLOOD LOSS: 5 cc  No intake or output data in the 24 hours ending 03/24/22 1205   BLOOD ADMINISTERED:none   LOCAL MEDICATIONS USED:  LIDOCAINE   SPECIMEN:  Source of Specimen:  Excision material from endometrial polyp and thickened endometrium and endometrial curettings.  DISPOSITION OF SPECIMEN:  PATHOLOGY  COUNTS:  YES  PLAN OF CARE: Transfer to PACU  Marie-Lyne LavoieMD12:05 PM

## 2022-03-24 NOTE — Anesthesia Postprocedure Evaluation (Signed)
Anesthesia Post Note  Patient: Karen Webster  Procedure(s) Performed: DILATATION & CURETTAGE/HYSTEROSCOPY WITH MYOSURE (Vagina )     Patient location during evaluation: PACU Anesthesia Type: General Level of consciousness: awake and alert, oriented and patient cooperative Pain management: pain level controlled Vital Signs Assessment: post-procedure vital signs reviewed and stable Respiratory status: spontaneous breathing, nonlabored ventilation and respiratory function stable Cardiovascular status: blood pressure returned to baseline and stable Postop Assessment: no apparent nausea or vomiting Anesthetic complications: no   No notable events documented.  Last Vitals:  Vitals:   03/24/22 1230 03/24/22 1313  BP: 113/72 135/81  Pulse: 65 79  Resp: 15 15  Temp:  36.7 C  SpO2: 95% 98%    Last Pain:  Vitals:   03/24/22 1001  TempSrc: Oral  PainSc: 0-No pain                 Pervis Hocking

## 2022-03-24 NOTE — Anesthesia Procedure Notes (Signed)
Procedure Name: LMA Insertion Date/Time: 03/24/2022 11:31 AM  Performed by: Pervis Hocking, DOPre-anesthesia Checklist: Timeout performed, Patient being monitored, Suction available, Emergency Drugs available and Patient identified Patient Re-evaluated:Patient Re-evaluated prior to induction Oxygen Delivery Method: Circle system utilized Preoxygenation: Pre-oxygenation with 100% oxygen Induction Type: IV induction Ventilation: Mask ventilation without difficulty LMA: LMA inserted LMA Size: 4.0 Placement Confirmation: breath sounds checked- equal and bilateral, CO2 detector and positive ETCO2

## 2022-03-24 NOTE — H&P (Signed)
Karen Webster Oct 03, 1960 [id number]        61 y.o.  G1P1L1  RP: HSC/Myosure Excision/D+C for Endometrial Polyp   HPI: PMB on HRT.  Endometrial Polyp per Sonohysto. No pelvic pain.  C/O burning with urination, urine odor and cloudiness.  No fever.  Pertinent Gynecological History: Menses: post-menopausal Contraception: post menopausal status Blood transfusions: none Sexually transmitted diseases: no past history  Last mammogram: normal  Last pap: normal   Menstrual History:   Past Medical History:  Diagnosis Date   Arthritis    knees   GERD (gastroesophageal reflux disease)    Hypertension    Seasonal allergies    SUI (stress urinary incontinence, female)     Past Surgical History:  Procedure Laterality Date   CATARACT EXTRACTION, BILATERAL     ~12 yrs ago   CHOLECYSTECTOMY     ~31 yrs ago   COLONOSCOPY  2018   HYSTEROSCOPY N/A 11/17/2017   Procedure: HYSTEROSCOPY FOR REMOVAL OF IUD;  Surgeon: Princess Bruins, MD;  Location: [facility];  Service: Gynecology;  Laterality: N/A;   KNEE SURGERY     Right knee, Dr Reynaldo Minium   NASAL SEPTUM SURGERY     ~35 yrs ago   [place]     Late 43s per pt    Family History  Problem Relation Age of Onset   Dementia Mother    Hypertension Mother    Hypertension Father    Atrial fibrillation Father    Heart attack Brother    Dementia Maternal Grandmother    Heart disease Maternal Grandfather    Breast cancer Paternal Grandmother    Cancer Paternal Grandmother    Heart disease Paternal Grandfather     Social History:  reports that she has never smoked. She has never used smokeless tobacco. She reports current alcohol use. She reports that she does not use drugs.  Allergies:  Allergies  Allergen Reactions   Nitrofurantoin Hives and Rash   Macrodantin [Nitrofurantoin Macrocrystal] Hives   Tape     "Makes skin raw" - paper tape better   Tetracycline Hives   Tetracyclines & Related  Hives   Bactrim [Sulfamethoxazole-Trimethoprim] Rash    Medications Prior to Admission  Medication Sig Dispense Refill Last Dose   acetaminophen (TYLENOL) 650 MG CR tablet Take 1,300 mg by mouth every 8 (eight) hours as needed for pain.   03/21/2022   Cholecalciferol (VITAMIN D3 PO) Take 1,000 Units by mouth daily.   Past Week   Cyanocobalamin (B-12 PO) Take 1 tablet by mouth daily.   Past Week   ibuprofen (ADVIL) 200 MG tablet Take 800 mg by mouth every 6 (six) hours as needed for moderate pain.   03/22/2022   omeprazole (PRILOSEC) 20 MG capsule TAKE ONE CAPSULE BY MOUTH DAILY (Patient taking differently: Take 20 mg by mouth daily.) 90 capsule 3 03/24/2022 at 0830   progesterone (PROMETRIUM) 100 MG capsule Take 1 capsule (100 mg total) by mouth at bedtime. 90 capsule 4 03/23/2022   triamterene-hydrochlorothiazide (DYAZIDE) 37.5-25 MG capsule Take 1 each (1 capsule total) by mouth daily. (Patient taking differently: Take 1 capsule by mouth daily. In am) 90 capsule 3 03/23/2022   Cyanocobalamin (VITAMIN B 12 PO) Take by mouth.      estradiol (VIVELLE-DOT) 0.0375 MG/24HR Place 1 patch onto the skin 2 (two) times a week. (Patient taking differently: Place 1 patch onto the skin 2 (two) times a week. On 03/24/22 L Lower ABD) 24 patch 4 03/22/2022  tirzepatide (MOUNJARO) 15 MG/0.5ML Pen Inject 15 mg into the skin once a week. (Patient taking differently: Inject 15 mg into the skin once a week. Wednesday morning, For weight loss) 6 mL 0 03/18/2022    REVIEW OF SYSTEMS: A ROS was performed and pertinent positives and negatives are included in the history.  GENERAL: No fevers or chills. HEENT: No change in vision, no earache, sore throat or sinus congestion. NECK: No pain or stiffness. CARDIOVASCULAR: No chest pain or pressure. No palpitations. PULMONARY: No shortness of breath, cough or wheeze. GASTROINTESTINAL: No abdominal pain, nausea, vomiting or diarrhea, melena or bright red blood per rectum.  GENITOURINARY: No urinary frequency, urgency, hesitancy or dysuria. MUSCULOSKELETAL: No joint or muscle pain, no back pain, no recent trauma. DERMATOLOGIC: No rash, no itching, no lesions. ENDOCRINE: No polyuria, polydipsia, no heat or cold intolerance. No recent change in weight. HEMATOLOGICAL: No anemia or easy bruising or bleeding. NEUROLOGIC: No headache, seizures, numbness, tingling or weakness. PSYCHIATRIC: No depression, no loss of interest in normal activity or change in sleep pattern.     Blood pressure 127/88, pulse 86, temperature 98 F (36.7 C), temperature source Oral, resp. rate 17, height '5\' 6"'$  (1.676 m), weight 93.3 kg, last menstrual period 10/17/2021, SpO2 99 %.    Results for orders placed or performed during the hospital encounter of 03/24/22 (from the past 24 hour(s))  CBC     Status: None   Collection Time: 03/24/22 10:16 AM  Result Value Ref Range   WBC 6.1 4.0 - 10.5 K/uL   RBC 4.64 3.87 - 5.11 MIL/uL   Hemoglobin 13.8 12.0 - 15.0 g/dL   HCT 41.0 36.0 - 46.0 %   MCV 88.4 80.0 - 100.0 fL   MCH 29.7 26.0 - 34.0 pg   MCHC 33.7 30.0 - 36.0 g/dL   RDW 12.2 11.5 - 15.5 %   Platelets 312 150 - 400 K/uL   nRBC 0.0 0.0 - 0.2 %  I-STAT, chem 8     Status: Abnormal   Collection Time: 03/24/22 10:20 AM  Result Value Ref Range   Sodium 139 135 - 145 mmol/L   Potassium 3.0 (L) 3.5 - 5.1 mmol/L   Chloride 99 98 - 111 mmol/L   BUN 15 8 - 23 mg/dL   Creatinine, Ser 0.80 0.44 - 1.00 mg/dL   Glucose, Bld 96 70 - 99 mg/dL   Calcium, Ion 1.16 1.15 - 1.40 mmol/L   TCO2 24 22 - 32 mmol/L   Hemoglobin 13.6 12.0 - 15.0 g/dL   HCT 40.0 36.0 - 46.0 %   General appearance:  Normal  SonoHysto 03/02/22: Limited vaginal ultrasound.  Retroverted uterus with multiple large fibroids again seen today.  The endometrial lining is thin, symmetrical measured at approximately 3.8 mm.  The cavity is partially distorted by adjacent larger fibroids.  Both ovaries are atrophic in appearance and  normal.  Positive perfusion to both ovaries.  No adnexal mass.  No free fluid in the pelvis.     The speculum  was inserted and the cervix cleansed with Betadine solution after confirming that patient has no allergies.A small sonohysterography catheterwas utilized.  Insertion was facilitated with ring forceps, using a spear-like motion the catheter was inserted to the fundus of the uterus. The speculum is then removed carefully to avoid dislodging the catheter. The catheter was flushed with sterile saline delete prior to insertion to rid it of small amounts of air.the sterile saline solution was infused into the uterine  cavity as a vaginal ultrasound probe was then placed in the vagina for full visualization of the uterine cavity from a transvaginal approach. The following was noted:   Sonohysterogram performed with saline injection.  A 0.8 x 0.4 cm intracavitary mass identified in the right lateral fundal aspect of the endometrial canal with prominent feeder vessel, probable small endometrial polyp.   The catheter was then removed after retrieving some of the saline from the intrauterine cavity. An endometrial biopsy not done. Patient tolerated procedure well. She had received a tablet of Aleve for discomfort.       U/A: Yellow cloudy, Pro Neg, Nit Positive, WBC 6-10, RBC Neg, Bacteria Many.  U. Culture pending.     Assessment/Plan:  61 y.o. G1P1    1. Postmenopausal bleeding  PMB on HRT.  Endometrial Polyp per Sonohysto.  No pelvic pain. Sonohysterogram performed with saline injection.  A 0.8 x 0.4 cm intracavitary mass identified in the right lateral fundal aspect of the endometrial canal with prominent feeder vessel, probable small endometrial polyp.  Decision to proceed with HSC/Myosure Excision/D+C.  Preop preparation, surgery with risks, including the risk of perforation of the uterus, and postop expectations and precautions thoroughly reviewed.  Patient voiced understanding and agreement.     2. Endometrial polyp Endometrial Polyp 8 mm x 4 mm.                          Patient was counseled as to the risk of surgery to include the following:   1. Infection (prohylactic antibiotics will be administered)   2. DVT/Pulmonary Embolism (prophylactic pneumo compression stockings will be used)   3.Trauma to internal organs requiring additional surgical procedure to repair any injury to internal organs requiring perhaps additional hospitalization days.   4.Hemmorhage requiring transfusion and blood products which carry risks such as anaphylactic reaction, hepatitis and AIDS   Patient had received literature information on the procedure scheduled and all her questions were answered and fully accepts all risk.     Princess Bruins MD, 11:10 AM 03/24/2022

## 2022-03-24 NOTE — Transfer of Care (Signed)
Immediate Anesthesia Transfer of Care Note  Patient: Makenze Ellett  Procedure(s) Performed: DILATATION & CURETTAGE/HYSTEROSCOPY WITH MYOSURE (Vagina )  Patient Location: PACU  Anesthesia Type:General  Level of Consciousness: awake, alert  and oriented  Airway & Oxygen Therapy: Patient Spontanous Breathing  Post-op Assessment: Report given to RN and Post -op Vital signs reviewed and stable  Post vital signs: Reviewed and stable  Last Vitals:  Vitals Value Taken Time  BP 125/76 03/24/22 1207  Temp 36.6 C 03/24/22 1207  Pulse 73 03/24/22 1208  Resp 14 03/24/22 1208  SpO2 99 % 03/24/22 1208  Vitals shown include unvalidated device data.  Last Pain:  Vitals:   03/24/22 1001  TempSrc: Oral  PainSc: 0-No pain      Patients Stated Pain Goal: 4 (38/37/79 3968)  Complications: No notable events documented.

## 2022-03-25 ENCOUNTER — Encounter (HOSPITAL_BASED_OUTPATIENT_CLINIC_OR_DEPARTMENT_OTHER): Payer: Self-pay | Admitting: Obstetrics & Gynecology

## 2022-03-25 ENCOUNTER — Telehealth: Payer: Self-pay

## 2022-03-25 LAB — BASIC METABOLIC PANEL
BUN: 15 mg/dL (ref 6–23)
CO2: 27 mEq/L (ref 19–32)
Calcium: 9.6 mg/dL (ref 8.4–10.5)
Chloride: 96 mEq/L (ref 96–112)
Creatinine, Ser: 1.09 mg/dL (ref 0.40–1.20)
GFR: 54.12 mL/min — ABNORMAL LOW (ref >60.00)
Glucose, Bld: 147 mg/dL — ABNORMAL HIGH (ref 70–99)
Potassium: 3.4 mEq/L — ABNORMAL LOW (ref 3.5–5.1)
Sodium: 136 mEq/L (ref 135–145)

## 2022-03-25 LAB — SURGICAL PATHOLOGY

## 2022-03-25 NOTE — Telephone Encounter (Signed)
-----   Message from Princess Bruins, MD sent at 03/25/2022  2:38 PM EDT ----- Patho benign  SURGICAL PATHOLOGY  CASE: [id number]  PATIENT: Karen Webster  Surgical Pathology Report      Clinical History: Thickened endometrium, endometrial polyp,  postmenopausal bleeding (crm)    FINAL MICROSCOPIC DIAGNOSIS:   A. ENDOMETRIUM, POLYP, CURETTAGE:  - Benign endometrial polyp  - Negative for hyperplasia or malignancy

## 2022-03-25 NOTE — Telephone Encounter (Signed)
Patient informed. Patient asked if she is still supposed to not use her estrogen patch or Prometrium until she comes in for post op visit?

## 2022-03-26 NOTE — Telephone Encounter (Signed)
  Karen Bruins, MD  You 2 minutes ago (2:20 PM)   If she can, I would like to see how she does without HRT.      I spoke with patient and she is agreeable to this plan.

## 2022-04-02 ENCOUNTER — Encounter: Payer: Self-pay | Admitting: Obstetrics & Gynecology

## 2022-04-02 ENCOUNTER — Ambulatory Visit (INDEPENDENT_AMBULATORY_CARE_PROVIDER_SITE_OTHER): Payer: 59 | Admitting: Obstetrics & Gynecology

## 2022-04-02 VITALS — BP 124/80 | Temp 98.5°F

## 2022-04-02 DIAGNOSIS — R309 Painful micturition, unspecified: Secondary | ICD-10-CM

## 2022-04-02 DIAGNOSIS — Z09 Encounter for follow-up examination after completed treatment for conditions other than malignant neoplasm: Secondary | ICD-10-CM

## 2022-04-02 MED ORDER — CIPROFLOXACIN HCL 500 MG PO TABS: 500.0000 mg | ORAL_TABLET | Freq: Two times a day (BID) | ORAL | 0 refills | Status: AC

## 2022-04-02 NOTE — Progress Notes (Signed)
    Karen Webster 01-26-1961 [id number]        61 y.o.  G1P1001   RP: Postop HSC/Myosure excision/D+C on 03/24/22  HPI: Good postop healing.  No vaginal bleeding, no vaginal discharge.  No pelvic pain.  Stopped HRT x surgery.  Minor hot flushes.  C/O pain with urination.  No fever.   OB History  Gravida Para Term Preterm AB Living  '1 1 1     1  '$ SAB IAB Ectopic Multiple Live Births               # Outcome Date GA Lbr Len/2nd Weight Sex Delivery Anes PTL Lv  1 Term             Past medical history,surgical history, problem list, medications, allergies, family history and social history were all reviewed and documented in the EPIC chart.   Directed ROS with pertinent positives and negatives documented in the history of present illness/assessment and plan.  Exam:  There were no vitals filed for this visit. General appearance:  Normal  CVAT Neg bilaterally  Abdomen: Normal Gyn exam deferred  U/A: Yellow cloudy, Pro Neg, Nit Neg, WBCs >60, RBC 0-2, Bacteria moderate.  Pending U. Culture.  Patho 03/24/22:   FINAL MICROSCOPIC DIAGNOSIS:   A. ENDOMETRIUM, POLYP, CURETTAGE:  - Benign endometrial polyp  - Negative for hyperplasia or malignancy    Assessment/Plan:  61 y.o. G1P1001   1. Status post gynecological surgery, follow-up exam Good postop healing.  No vaginal bleeding, no vaginal discharge.  No pelvic pain. No fever.  Pathology reviewed with patient, benign.  Patient is well without HRT.  Decision not to restart on HRT.  2. Pain with urination C/O pain with urination.  U/A abnormal.  Counseling done on prevention of UTIs.  Will treat with Cipro.  Usage known.  Prescription sent to pharmacy. - Urinalysis,Complete w/RFL Culture  Other orders - ciprofloxacin (CIPRO) 500 MG tablet; Take 1 tablet (500 mg total) by mouth 2 (two) times daily for 7 days.   Princess Bruins MD, 4:41 PM 04/02/2022

## 2022-04-05 LAB — URINE CULTURE
MICRO NUMBER:: 13858392
SPECIMEN QUALITY:: ADEQUATE

## 2022-04-05 LAB — URINALYSIS, COMPLETE W/RFL CULTURE
Bilirubin Urine: NEGATIVE
Casts: NONE SEEN /LPF
Crystals: NONE SEEN /HPF
Glucose, UA: NEGATIVE
Hyaline Cast: NONE SEEN /LPF
Ketones, ur: NEGATIVE
Nitrites, Initial: NEGATIVE
Protein, ur: NEGATIVE
Specific Gravity, Urine: 1.02 (ref 1.001–1.035)
WBC, UA: >=60 /HPF — AB (ref 0–5)
Yeast: NONE SEEN /HPF
pH: 6 (ref 5.0–8.0)

## 2022-04-05 LAB — CULTURE INDICATED

## 2022-05-01 ENCOUNTER — Other Ambulatory Visit: Payer: Self-pay | Admitting: Obstetrics & Gynecology

## 2022-05-27 ENCOUNTER — Encounter: Payer: Self-pay | Admitting: Family Medicine

## 2022-05-28 MED ORDER — TIRZEPATIDE 15 MG/0.5ML ~~LOC~~ SOAJ: 15.0000 mg | SUBCUTANEOUS | 0 refills | Status: DC

## 2022-05-28 NOTE — Telephone Encounter (Signed)
Ok to refill.  [name]. [name], MD 05/28/2022 2:30 PM

## 2022-08-12 ENCOUNTER — Other Ambulatory Visit: Payer: Self-pay | Admitting: Family Medicine

## 2022-08-17 ENCOUNTER — Encounter: Payer: Self-pay | Admitting: Family Medicine

## 2022-08-19 NOTE — Telephone Encounter (Signed)
Ok to refill?

## 2022-08-19 NOTE — Telephone Encounter (Signed)
Ok to refill.  [name]. [name], MD 08/19/2022 10:54 AM

## 2022-08-20 ENCOUNTER — Other Ambulatory Visit: Payer: Self-pay | Admitting: *Deleted

## 2022-08-20 ENCOUNTER — Encounter: Payer: Self-pay | Admitting: Family Medicine

## 2022-08-20 MED ORDER — TIRZEPATIDE 15 MG/0.5ML ~~LOC~~ SOAJ: 15.0000 mg | SUBCUTANEOUS | 0 refills | Status: DC

## 2022-08-20 NOTE — Telephone Encounter (Signed)
Rx send to [facility] pharmacy, patient notified

## 2022-08-21 NOTE — Telephone Encounter (Signed)
Ok to refill. Karen Webster we have her schedule a follow up with me soon?  [name]. [name], MD 08/21/2022 10:34 AM

## 2022-09-01 ENCOUNTER — Ambulatory Visit (INDEPENDENT_AMBULATORY_CARE_PROVIDER_SITE_OTHER): Payer: 59 | Admitting: Family Medicine

## 2022-09-01 ENCOUNTER — Encounter: Payer: Self-pay | Admitting: Family Medicine

## 2022-09-01 VITALS — BP 102/71 | HR 85 | Temp 97.1°F | Ht 66.0 in | Wt 195.6 lb

## 2022-09-01 DIAGNOSIS — N390 Urinary tract infection, site not specified: Secondary | ICD-10-CM

## 2022-09-01 DIAGNOSIS — R739 Hyperglycemia, unspecified: Secondary | ICD-10-CM

## 2022-09-01 DIAGNOSIS — I10 Essential (primary) hypertension: Secondary | ICD-10-CM

## 2022-09-01 DIAGNOSIS — Z23 Encounter for immunization: Secondary | ICD-10-CM

## 2022-09-01 DIAGNOSIS — E785 Hyperlipidemia, unspecified: Secondary | ICD-10-CM | POA: Diagnosis not present

## 2022-09-01 DIAGNOSIS — F321 Major depressive disorder, single episode, moderate: Secondary | ICD-10-CM

## 2022-09-01 DIAGNOSIS — Z0001 Encounter for general adult medical examination with abnormal findings: Secondary | ICD-10-CM

## 2022-09-01 LAB — POCT URINALYSIS DIPSTICK
Bilirubin, UA: POSITIVE — NL
Blood, UA: NEGATIVE
Glucose, UA: NEGATIVE
Ketones, UA: NEGATIVE
Nitrite, UA: NEGATIVE
Protein, UA: POSITIVE — AB
Spec Grav, UA: 1.02 (ref 1.010–1.025)
Urobilinogen, UA: 0.2 E.U./dL — AB
pH, UA: 6 (ref 5.0–8.0)

## 2022-09-01 LAB — CBC
HCT: 39.5 % (ref 36.0–46.0)
Hemoglobin: 13.5 g/dL (ref 12.0–15.0)
MCHC: 34.2 g/dL (ref 30.0–36.0)
MCV: 87.3 fl (ref 78.0–100.0)
Platelets: 307 10*3/uL (ref 150.0–400.0)
RBC: 4.52 Mil/uL (ref 3.87–5.11)
RDW: 12.9 % (ref 11.5–15.5)
WBC: 6.2 10*3/uL (ref 4.0–10.5)

## 2022-09-01 LAB — HEMOGLOBIN A1C: Hgb A1c MFr Bld: 5.2 % (ref 4.6–6.5)

## 2022-09-01 LAB — COMPREHENSIVE METABOLIC PANEL
ALT: 9 U/L (ref 0–35)
AST: 12 U/L (ref 0–37)
Albumin: 4.2 g/dL (ref 3.5–5.2)
Alkaline Phosphatase: 57 U/L (ref 39–117)
BUN: 17 mg/dL (ref 6–23)
CO2: 32 mEq/L (ref 19–32)
Calcium: 9.2 mg/dL (ref 8.4–10.5)
Chloride: 100 mEq/L (ref 96–112)
Creatinine, Ser: 0.8 mg/dL (ref 0.40–1.20)
GFR: 78.2 mL/min (ref >60.00)
Glucose, Bld: 83 mg/dL (ref 70–99)
Potassium: 3.6 mEq/L (ref 3.5–5.1)
Sodium: 140 mEq/L (ref 135–145)
Total Bilirubin: 0.5 mg/dL (ref 0.2–1.2)
Total Protein: 6.5 g/dL (ref 6.0–8.3)

## 2022-09-01 LAB — LIPID PANEL
Cholesterol: 169 mg/dL (ref 0–200)
HDL: 41.7 mg/dL (ref >39.00)
LDL Cholesterol: 103 mg/dL — ABNORMAL HIGH (ref 0–99)
NonHDL: 127.16
Total CHOL/HDL Ratio: 4
Triglycerides: 123 mg/dL (ref 0.0–149.0)
VLDL: 24.6 mg/dL (ref 0.0–40.0)

## 2022-09-01 LAB — IBC + FERRITIN
Ferritin: 39.6 ng/mL (ref 10.0–291.0)
Iron: 92 ug/dL (ref 42–145)
Saturation Ratios: 26.4 % (ref 20.0–50.0)
TIBC: 348.6 ug/dL (ref 250.0–450.0)
Transferrin: 249 mg/dL (ref 212.0–360.0)

## 2022-09-01 LAB — TSH: TSH: 1.45 u[IU]/mL (ref 0.35–5.50)

## 2022-09-01 MED ORDER — CEPHALEXIN 500 MG PO CAPS: 500.0000 mg | ORAL_CAPSULE | Freq: Three times a day (TID) | ORAL | 0 refills | Status: AC

## 2022-09-01 NOTE — Patient Instructions (Signed)
It was very nice to see you today!    Please start the Keflex for your UTI.  I will also refer you to see a urologist to see if there is anything that could be causing these to be recurrent.  The Keflex will treat your sinus infection as well.  Your blood pressure is too low.  Please stop your blood pressure pill. Send me a message in a week or 2 to let me know how your blood pressure is looking and if you are having any swelling in your legs.  I think this will help with your fatigue.  We will check blood work today.  Will give your shingles vaccine today.  I will see back in year for your next physical.  Come back sooner if needed.  Take care, Dr [name]  PLEASE NOTE:  If you had any lab tests, please let us know if you have not heard back within a few days. You may see your results on mychart before we have a chance to review them but we will give you a call once they are reviewed by [name].   If we ordered any referrals today, please let us know if you have not heard from their office within the next week.   If you had any urgent prescriptions sent in today, please check with the pharmacy within an hour of our visit to make sure the prescription was transmitted appropriately.   Please try these tips to maintain a healthy lifestyle:  Eat at least 3 REAL meals and 1-2 snacks per day.  Aim for no more than 5 hours between eating.  If you eat breakfast, please do so within one hour of getting up.   Each meal should contain half fruits/vegetables, one quarter protein, and one quarter carbs (no bigger than a computer mouse)  Cut down on sweet beverages. This includes juice, soda, and sweet tea.   Drink at least 1 glass of water with each meal and aim for at least 8 glasses per day  Exercise at least 150 minutes every week.    Preventive Care 62 Years Old, Female Preventive care refers to lifestyle choices and visits with your health care provider that can promote health and  wellness. Preventive care visits are also called wellness exams. What can I expect for my preventive care visit? Counseling Your health care provider may ask you questions about your: Medical history, including: Past medical problems. Family medical history. Pregnancy history. Current health, including: Menstrual cycle. Method of birth control. Emotional well-being. Home life and relationship well-being. Sexual activity and sexual health. Lifestyle, including: Alcohol, nicotine or tobacco, and drug use. Access to firearms. Diet, exercise, and sleep habits. Work and work Statistician. Sunscreen use. Safety issues such as seatbelt and bike helmet use. Physical exam Your health care provider will check your: Height and weight. These may be used to calculate your BMI (body mass index). BMI is a measurement that tells if you are at a healthy weight. Waist circumference. This measures the distance around your waistline. This measurement also tells if you are at a healthy weight and may help predict your risk of certain diseases, such as type 2 diabetes and high blood pressure. Heart rate and blood pressure. Body temperature. Skin for abnormal spots. What immunizations do I need?  Vaccines are usually given at various ages, according to a schedule. Your health care provider will recommend vaccines for you based on your age, medical history, and lifestyle or other factors, such  as travel or where you work. What tests do I need? Screening Your health care provider may recommend screening tests for certain conditions. This may include: Lipid and cholesterol levels. Diabetes screening. This is done by checking your blood sugar (glucose) after you have not eaten for a while (fasting). Pelvic exam and Pap test. Hepatitis B test. Hepatitis C test. HIV (human immunodeficiency virus) test. STI (sexually transmitted infection) testing, if you are at risk. Lung cancer screening. Colorectal  cancer screening. Mammogram. Talk with your health care provider about when you should start having regular mammograms. This may depend on whether you have a family history of breast cancer. BRCA-related cancer screening. This may be done if you have a family history of breast, ovarian, tubal, or peritoneal cancers. Bone density scan. This is done to screen for osteoporosis. Talk with your health care provider about your test results, treatment options, and if necessary, the need for more tests. Follow these instructions at home: Eating and drinking  Eat a diet that includes fresh fruits and vegetables, whole grains, lean protein, and low-fat dairy products. Take vitamin and mineral supplements as recommended by your health care provider. Do not drink alcohol if: Your health care provider tells you not to drink. You are pregnant, may be pregnant, or are planning to become pregnant. If you drink alcohol: Limit how much you have to 0-1 drink a day. Know how much alcohol is in your drink. In the [place] one drink equals one 12 oz bottle of beer (355 mL), one 5 oz glass of wine (148 mL), or one 1 oz glass of hard liquor (44 mL). Lifestyle Brush your teeth every morning and night with fluoride toothpaste. Floss one time each day. Exercise for at least 30 minutes 5 or more days each week. Do not use any products that contain nicotine or tobacco. These products include cigarettes, chewing tobacco, and vaping devices, such as e-cigarettes. If you need help quitting, ask your health care provider. Do not use drugs. If you are sexually active, practice safe sex. Use a condom or other form of protection to prevent STIs. If you do not wish to become pregnant, use a form of birth control. If you plan to become pregnant, see your health care provider for a prepregnancy visit. Take aspirin only as told by your health care provider. Make sure that you understand how much to take and what form to take. Work  with your health care provider to find out whether it is safe and beneficial for you to take aspirin daily. Find healthy ways to manage stress, such as: Meditation, yoga, or listening to music. Journaling. Talking to a trusted person. Spending time with friends and family. Minimize exposure to UV radiation to reduce your risk of skin cancer. Safety Always wear your seat belt while driving or riding in a vehicle. Do not drive: If you have been drinking alcohol. Do not ride with someone who has been drinking. When you are tired or distracted. While texting. If you have been using any mind-altering substances or drugs. Wear a helmet and other protective equipment during sports activities. If you have firearms in your house, make sure you follow all gun safety procedures. Seek help if you have been physically or sexually abused. What's next? Visit your health care provider once a year for an annual wellness visit. Ask your health care provider how often you should have your eyes and teeth checked. Stay up to date on all vaccines. This information is not  intended to replace advice given to you by your health care provider. Make sure you discuss any questions you have with your health care provider. Document Revised: 01/15/2021 Document Reviewed: 01/15/2021 Elsevier Patient Education  [place].

## 2022-09-01 NOTE — Assessment & Plan Note (Signed)
Blood pressure 102/71.  She is now only on [place] 37.5-25 once daily.  Due to her fatigue and low readings today we will stop her antihypertensives completely.  We are checking labs today.  She will follow-up with me in a couple weeks via MyChart.  If she has increased in blood pressure readings or increased lower extreme edema will likely start HCTZ 12.5 mg daily.

## 2022-09-01 NOTE — Progress Notes (Signed)
Chief Complaint:  Karen Webster is a 62 y.o. female who presents today for her annual comprehensive physical exam.    Assessment/Plan:  New/Acute Problems: Sinusitis  No red flags.  No signs of systemic illness.  Will be treating her UTI with course of Keflex as below which should help with her sinusitis as well.  Fatigue  Will check labs today including CBC, c-Met, TSH, iron panel.  Blood pressure is on the low side today which is likely the main contributor.  Will be stopping her antihypertensive as below.  She will follow-up me in a couple weeks via MyChart.    UTI History and urinalysis consistent with recurrent UTI.  This is her fourth UTI in the past year.  Will start Keflex.  Urine culture is pending.  Will place referral to urology due to recurrent nature.   Chronic Problems Addressed Today: Essential hypertension Blood pressure 102/71.  She is now only on [place] 37.5-25 once daily.  Due to her fatigue and low readings today we will stop her antihypertensives completely.  We are checking labs today.  She will follow-up with me in a couple weeks via MyChart.  If she has increased in blood pressure readings or increased lower extreme edema will likely start HCTZ 12.5 mg daily.  Hyperglycemia She is doing well on Mounjaro.  Check A1c today.  Dyslipidemia Check lipids.  Morbid obesity ([place]) Patient down about 50 pounds overall since starting the [facility].  She is doing well.  Constipation is manageable.  Will continue current regimen for now with 50 mg daily.  Recurrent UTI Patient with 4 UTIs in the past year.  We will refer to urology.  We are treating her UTI today as above.   Preventative Healthcare: Shingles vaccine given today.  Check labs.  Up-to-date on Pap and colon cancer screening.  Patient Counseling(The following topics were reviewed and/or handout was given):  -Nutrition: Stressed importance of moderation in sodium/caffeine intake, saturated fat and  cholesterol, caloric balance, sufficient intake of fresh fruits, vegetables, and fiber.  -Stressed the importance of regular exercise.   -Substance Abuse: Discussed cessation/primary prevention of tobacco, alcohol, or other drug use; driving or other dangerous activities under the influence; availability of treatment for abuse.   -Injury prevention: Discussed safety belts, safety helmets, smoke detector, smoking near bedding or upholstery.   -Sexuality: Discussed sexually transmitted diseases, partner selection, use of condoms, avoidance of unintended pregnancy and contraceptive alternatives.   -Dental health: Discussed importance of regular tooth brushing, flossing, and dental visits.  -Health maintenance and immunizations reviewed. Please refer to Health maintenance section.  Return to care in 1 year for next preventative visit.     Subjective:  HPI:  See A/p for status of chronic conditions.  She is concerned about a UTI. She has noticed increased urgency and burning. This has been going on for about a month. Feels like a typical UTI. Sometimes feels like she is not completely emptying her bladder.   She is also concerned about a sinus infection. This started a couple of weeks ago. She has had some stuffiness in her ears. Some neck pain. No fevers or chills. Some burning in left side.   She has had some fatigue for the last few months.  Feels cold.  She would like to have blood work done today.  Lifestyle Diet: Balanced. Plenty of fruits and vegetables.  Exercise: None.      01/05/2022    2:08 PM  Depression screen PHQ 2/9  Decreased Interest 0  Down, Depressed, Hopeless 0  PHQ - 2 Score 0  Altered sleeping 1  Tired, decreased energy 1  Change in appetite 1  Feeling bad or failure about yourself  0  Trouble concentrating 0  Moving slowly or fidgety/restless 0  Suicidal thoughts 0  PHQ-9 Score 3  Difficult doing work/chores Not difficult at all    There are no preventive  care reminders to display for this patient.    ROS: Per HPI, otherwise a complete review of systems was negative.   PMH:  The following were reviewed and entered/updated in epic: Past Medical History:  Diagnosis Date   Arthritis    knees   GERD (gastroesophageal reflux disease)    Hypertension    Seasonal allergies    SUI (stress urinary incontinence, female)    Patient Active Problem List   Diagnosis Date Noted   Recurrent UTI 09/01/2022   Insomnia 09/20/2020   Depression, major, single episode, moderate ([place]) 09/19/2019   Morbid obesity ([place]) 09/19/2019   Dyslipidemia 09/16/2018   Essential hypertension 09/14/2018   Gastroesophageal reflux disease without esophagitis 09/14/2018   Menopausal symptoms 09/14/2018   Hyperglycemia 09/14/2018   Cervical radiculopathy secondary to DDD 08/25/2018   Past Surgical History:  Procedure Laterality Date   CATARACT EXTRACTION, BILATERAL     ~12 yrs ago   CHOLECYSTECTOMY     ~31 yrs ago   COLONOSCOPY  2018   [place] N/A 03/24/2022   Procedure: [place];  Surgeon: Princess Bruins, MD;  Location: [facility];  Service: Gynecology;  Laterality: N/A;   HYSTEROSCOPY N/A 11/17/2017   Procedure: HYSTEROSCOPY FOR REMOVAL OF IUD;  Surgeon: Princess Bruins, MD;  Location: [facility];  Service: Gynecology;  Laterality: N/A;   KNEE SURGERY     Right knee, Dr Reynaldo Minium   NASAL SEPTUM SURGERY     ~35 yrs ago   [place]     Late 60s per pt    Family History  Problem Relation Age of Onset   Dementia Mother    Hypertension Mother    Hypertension Father    Atrial fibrillation Father    Heart attack Brother    Dementia Maternal Grandmother    Heart disease Maternal Grandfather    Breast cancer Paternal Grandmother    Cancer Paternal Grandmother    Heart disease Paternal Grandfather     Medications- reviewed and  updated Current Outpatient Medications  Medication Sig Dispense Refill   acetaminophen (TYLENOL) 650 MG CR tablet Take 1,300 mg by mouth every 8 (eight) hours as needed for pain.     cephALEXin (KEFLEX) 500 MG capsule Take 1 capsule (500 mg total) by mouth 3 (three) times daily for 7 days. Take for 7 days 21 capsule 0   Cholecalciferol (VITAMIN D3 PO) Take 1,000 Units by mouth daily.     Cyanocobalamin (B-12 PO) Take 1 tablet by mouth daily.     ibuprofen (ADVIL) 200 MG tablet Take 800 mg by mouth every 6 (six) hours as needed for moderate pain.     omeprazole (PRILOSEC) 20 MG capsule TAKE ONE CAPSULE BY MOUTH DAILY (Patient taking differently: Take 20 mg by mouth daily.) 90 capsule 3   tirzepatide (MOUNJARO) 15 MG/0.5ML Pen Inject 15 mg into the skin once a week. 6.5 mL 0   No current facility-administered medications for this visit.    Allergies-reviewed and updated Allergies  Allergen Reactions  Nitrofurantoin Hives and Rash   Macrodantin [Nitrofurantoin Macrocrystal] Hives   Tape     "Makes skin raw" - paper tape better   Tetracycline Hives   Tetracyclines & Related Hives   Bactrim [Sulfamethoxazole-Trimethoprim] Rash    Social History   Socioeconomic History   Marital status: Widowed    Spouse name: Not on file   Number of children: Not on file   Years of education: Not on file   Highest education level: Not on file  Occupational History   Not on file  Tobacco Use   Smoking status: Never   Smokeless tobacco: Never  Vaping Use   Vaping Use: Never used  Substance and Sexual Activity   Alcohol use: Yes    Comment: SOCIAL    Drug use: Never   Sexual activity: Not Currently    Partners: Male    Birth control/protection: Post-menopausal    Comment: 1st intercourse- 89, partners-more than 5  Other Topics Concern   Not on file  Social History Narrative   Not on file   Social Determinants of Health   Financial Resource Strain: Not on file  Food Insecurity: Not on  file  Transportation Needs: Not on file  Physical Activity: Not on file  Stress: Not on file  Social Connections: Not on file        Objective:  Physical Exam: BP 102/71   Pulse 85   Temp (!) 97.1 F (36.2 C) (Temporal)   Ht '5\' 6"'$  (1.676 m)   Wt 195 lb 9.6 oz (88.7 kg)   LMP 10/17/2021 (Exact Date) Comment: spotting  SpO2 97%   BMI 31.57 kg/m   Body mass index is 31.57 kg/m. Wt Readings from Last 3 Encounters:  09/01/22 195 lb 9.6 oz (88.7 kg)  03/24/22 208 lb (94.3 kg)  03/24/22 205 lb 9.6 oz (93.3 kg)   Gen: NAD, resting comfortably HEENT: TMs normal bilaterally. OP clear. No thyromegaly noted.  CV: RRR with no murmurs appreciated Pulm: NWOB, CTAB with no crackles, wheezes, or rhonchi GI: Normal bowel sounds present. Soft, Nontender, Nondistended. MSK: no edema, cyanosis, or clubbing noted Skin: warm, dry Neuro: CN2-12 grossly intact. Strength 5/5 in upper and lower extremities. Reflexes symmetric and intact bilaterally.  Psych: Normal affect and thought content     Faris Coolman M. [name], MD 09/01/2022 8:37 AM

## 2022-09-01 NOTE — Assessment & Plan Note (Signed)
Patient with 4 UTIs in the past year.  We will refer to urology.  We are treating her UTI today as above.

## 2022-09-01 NOTE — Assessment & Plan Note (Signed)
Check lipids 

## 2022-09-01 NOTE — Assessment & Plan Note (Signed)
She is doing well on Mounjaro.  Check A1c today.

## 2022-09-01 NOTE — Assessment & Plan Note (Signed)
Patient down about 50 pounds overall since starting the [facility].  She is doing well.  Constipation is manageable.  Will continue current regimen for now with 50 mg daily.

## 2022-09-01 NOTE — Addendum Note (Signed)
Addended by: Betti Cruz on: 09/01/2022 12:00 PM   Modules accepted: Orders

## 2022-09-02 LAB — URINE CULTURE
MICRO NUMBER:: 14493105
Result:: NO GROWTH
SPECIMEN QUALITY:: ADEQUATE

## 2022-09-04 NOTE — Progress Notes (Signed)
Please inform patient of the following:  Urine culture does not show any signs of UTI.  She can finish her antibiotics and let us know if symptoms or not improving.  Her cholesterol is a little bit elevated but better than last year.  Everything else is normal. Do not need to make any other changes to her treatment plan at this time.  She should continue to work on diet and exercise and we can recheck in a year or so.

## 2022-09-05 IMAGING — DX DG CHEST 2V
2 series · 2 of 2 positions shown · non-contrast
Comparison: Two-view chest x-ray 06/22/2017

CLINICAL DATA: Shortness of breath.  Hypotension.

EXAM:
CHEST - 2 VIEW

[chest pa]
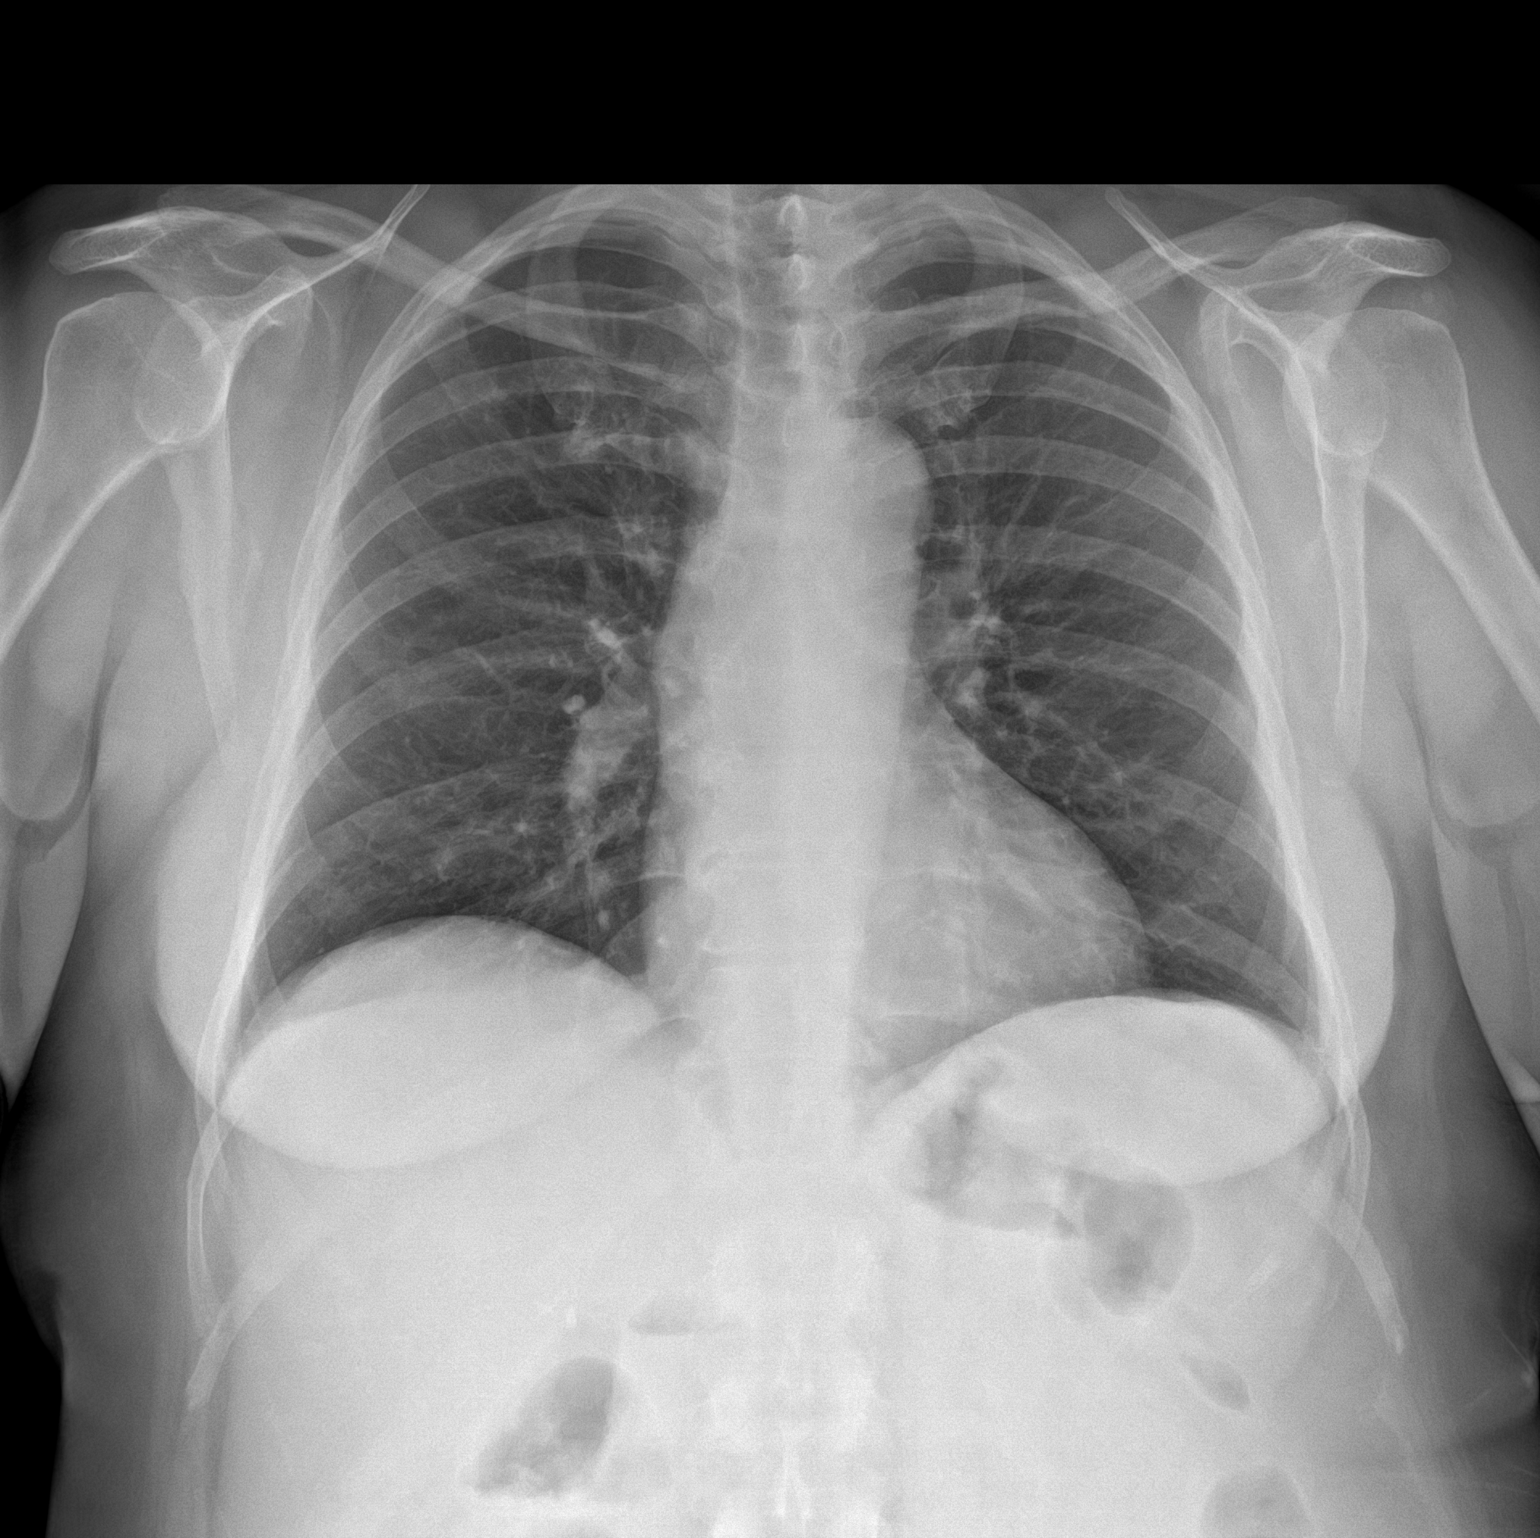

[chest lat]
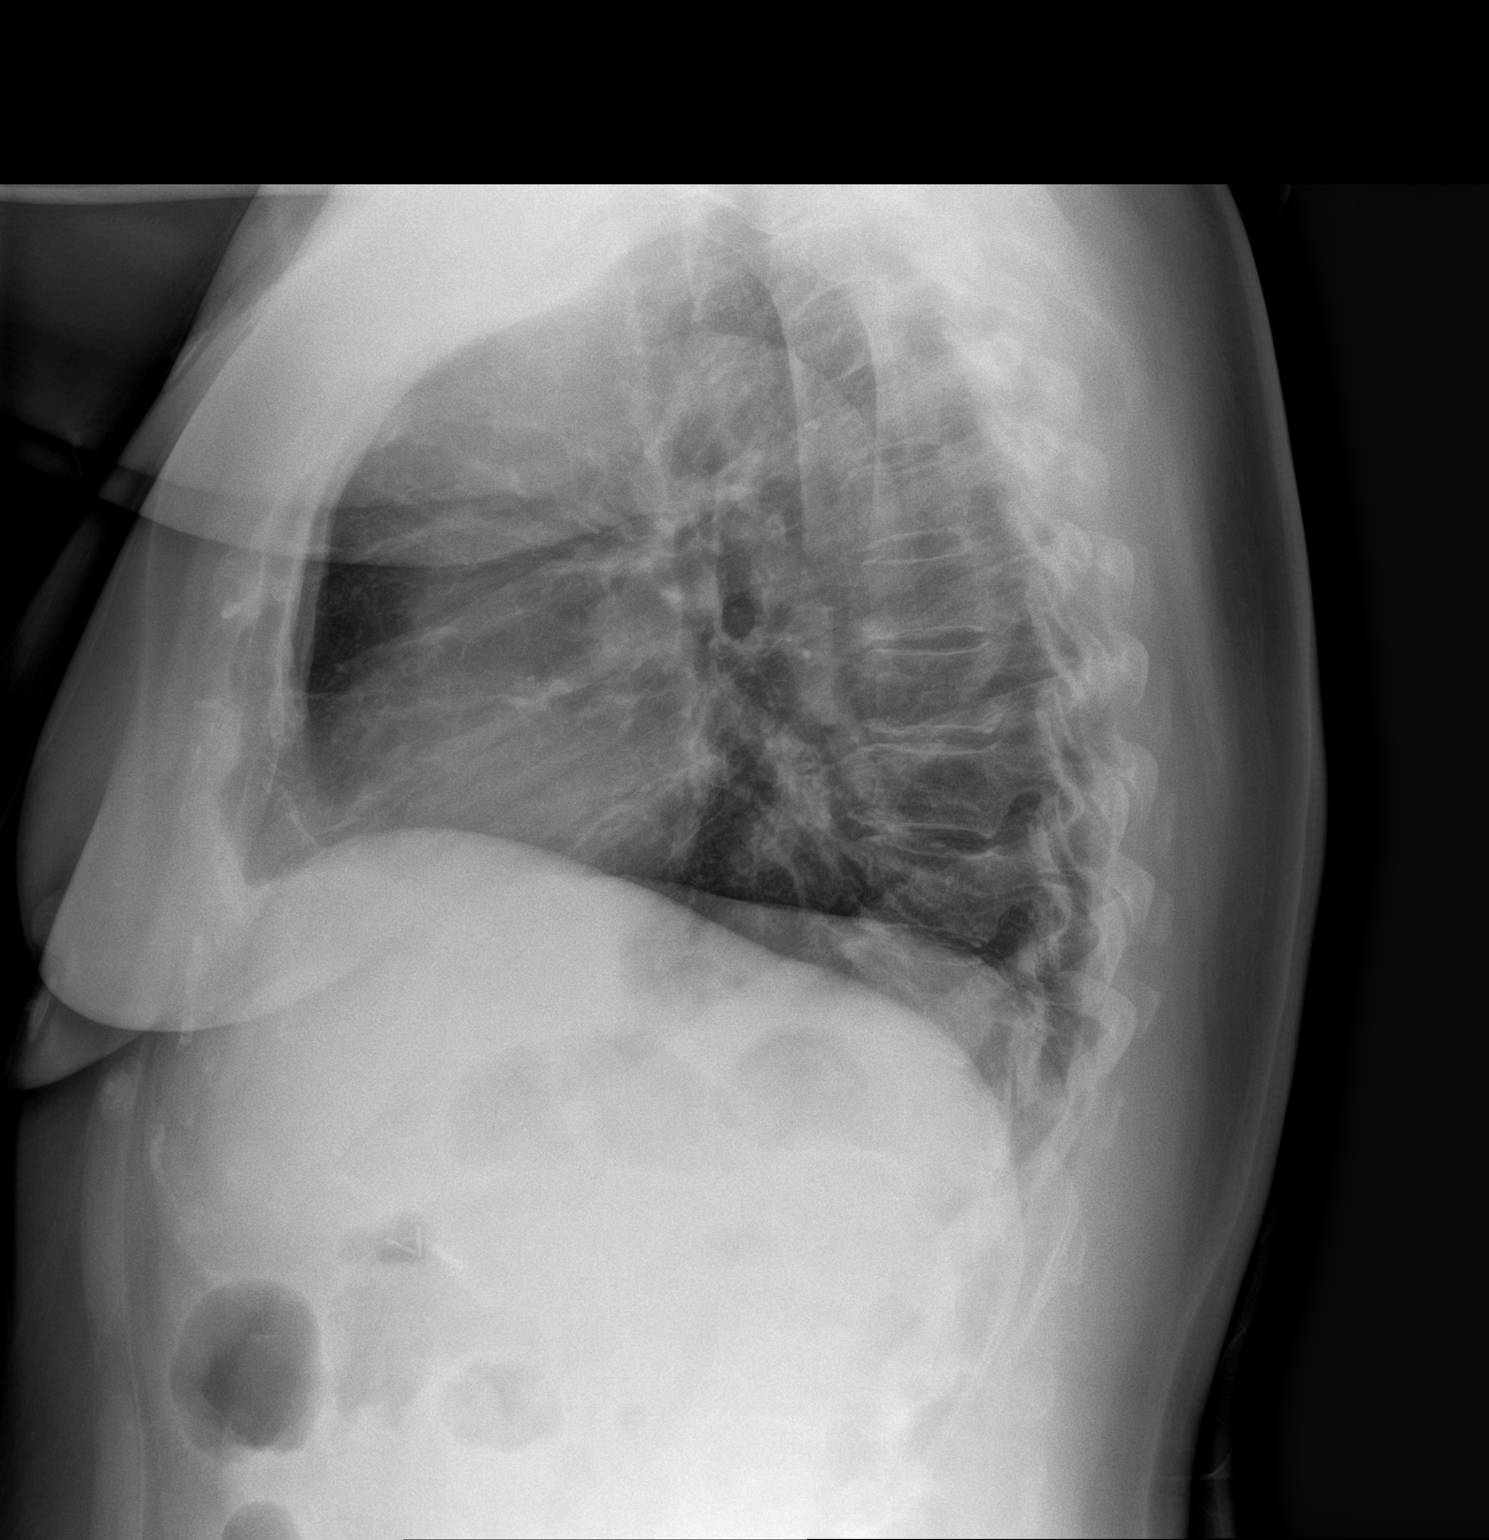

[2 of 2 positions shown; findings below may reference images not displayed]

FINDINGS: Heart size is normal. Changes of COPD are again noted. No airspace
disease is present. No edema or effusion is present. Mild
degenerative changes of the thoracic spine are stable.
IMPRESSION: No acute cardiopulmonary disease or significant interval change.

## 2022-09-07 ENCOUNTER — Encounter: Payer: Self-pay | Admitting: Family Medicine

## 2022-09-07 NOTE — Telephone Encounter (Signed)
Ok to send in [place] 12.'5mg'$  once daily.  She needs to follow up with [name] in a couple of weeks.   [name]. [name], MD 09/07/2022 2:59 PM

## 2022-09-07 NOTE — Telephone Encounter (Signed)
Please advise 

## 2022-09-08 ENCOUNTER — Telehealth: Payer: Self-pay

## 2022-09-08 ENCOUNTER — Other Ambulatory Visit: Payer: Self-pay

## 2022-09-08 MED ORDER — HYDROCHLOROTHIAZIDE 12.5 MG PO CAPS: 12.5000 mg | ORAL_CAPSULE | Freq: Every day | ORAL | 0 refills | Status: DC

## 2022-09-08 NOTE — Telephone Encounter (Signed)
Recently taken off BP meds. C/o of lower leg swelling, mainly right leg and some hand tightness. States she is no longer dizzy. States PCP said she might need a fluid pill, open to trying pill now.   Also states concerns that her CO2 levels have been elevated over the last 2 yrs, wondering what can cause this.

## 2022-09-08 NOTE — Telephone Encounter (Signed)
Also states concerns that her CO2 levels have been elevated over the last 2 yrs, wondering what can cause this.

## 2022-09-08 NOTE — Telephone Encounter (Signed)
Her CO2 levels are not concerning.  This is a measure of her acid-base status in her blood and can be impacted by several different factors including hydration and how fast that she is breathing at the time of blood draw.  We will continue to monitor this but there is nothing specific that she needs to do regarding this.

## 2022-09-08 NOTE — Telephone Encounter (Signed)
Please see the mychart message. Ok to send in HCTZ 12.'5mg'$  daily.  Would like for her to follow up with [name] in a week or two.  Karen Webster. [name], MD 09/08/2022 10:23 AM

## 2022-09-10 NOTE — Telephone Encounter (Signed)
Mychart message with PCP comment sent to patient.

## 2022-09-21 ENCOUNTER — Other Ambulatory Visit: Payer: Self-pay | Admitting: Family Medicine

## 2022-09-24 ENCOUNTER — Other Ambulatory Visit: Payer: Self-pay | Admitting: Family Medicine

## 2022-09-25 MED ORDER — HYDROCHLOROTHIAZIDE 12.5 MG PO CAPS: 12.5000 mg | ORAL_CAPSULE | Freq: Every day | ORAL | 3 refills | Status: DC

## 2022-11-11 ENCOUNTER — Encounter: Payer: Self-pay | Admitting: Family Medicine

## 2022-11-11 ENCOUNTER — Other Ambulatory Visit: Payer: Self-pay | Admitting: *Deleted

## 2022-11-11 MED ORDER — TIRZEPATIDE 15 MG/0.5ML ~~LOC~~ SOAJ: 15.0000 mg | SUBCUTANEOUS | 0 refills | Status: DC

## 2022-12-31 ENCOUNTER — Ambulatory Visit: Payer: 59

## 2023-01-13 ENCOUNTER — Encounter: Payer: Self-pay | Admitting: Family Medicine

## 2023-01-13 ENCOUNTER — Ambulatory Visit: Payer: 59 | Admitting: Family Medicine

## 2023-01-13 VITALS — BP 124/80 | HR 87 | Temp 97.3°F | Ht 66.0 in | Wt 200.0 lb

## 2023-01-13 DIAGNOSIS — K219 Gastro-esophageal reflux disease without esophagitis: Secondary | ICD-10-CM

## 2023-01-13 DIAGNOSIS — I10 Essential (primary) hypertension: Secondary | ICD-10-CM | POA: Diagnosis not present

## 2023-01-13 DIAGNOSIS — R1013 Epigastric pain: Secondary | ICD-10-CM

## 2023-01-13 DIAGNOSIS — M533 Sacrococcygeal disorders, not elsewhere classified: Secondary | ICD-10-CM

## 2023-01-13 DIAGNOSIS — F439 Reaction to severe stress, unspecified: Secondary | ICD-10-CM

## 2023-01-13 DIAGNOSIS — R1031 Right lower quadrant pain: Secondary | ICD-10-CM

## 2023-01-13 MED ORDER — PANTOPRAZOLE SODIUM 40 MG PO TBEC: 40.0000 mg | DELAYED_RELEASE_TABLET | Freq: Every day | ORAL | 3 refills | Status: DC

## 2023-01-13 MED ORDER — PREDNISONE 50 MG PO TABS: ORAL_TABLET | ORAL | 0 refills | Status: DC

## 2023-01-13 NOTE — Patient Instructions (Signed)
It was very nice to see you today!  I think the pain in your belly is probably due to the [facility] powders.  Please do not take any more BC powders or other anti-inflammatories.  Start the Protonix.  Let us know if not improving in the next few days.  We will also check a stress test to further evaluate for your heart.  Your tailbone pain is probably due to inflammation due to increased stress to the area.  Please use a donut cushion or pillow to the area.  We will also start prednisone which should help.  The pain in your right groin is probably due to pain in your right hip joint.  This should improve with the prednisone.  Let me know if not proving in the next 1 to 2 weeks.  Return if symptoms worsen or fail to improve.   Take care, Dr Jimmey Ralph  PLEASE NOTE:  If you had any lab tests, please let us know if you have not heard back within a few days. You may see your results on mychart before we have a chance to review them but we will give you a call once they are reviewed by [name].   If we ordered any referrals today, please let us know if you have not heard from their office within the next week.   If you had any urgent prescriptions sent in today, please check with the pharmacy within an hour of our visit to make sure the prescription was transmitted appropriately.   Please try these tips to maintain a healthy lifestyle:  Eat at least 3 REAL meals and 1-2 snacks per day.  Aim for no more than 5 hours between eating.  If you eat breakfast, please do so within one hour of getting up.   Each meal should contain half fruits/vegetables, one quarter protein, and one quarter carbs (no bigger than a computer mouse)  Cut down on sweet beverages. This includes juice, soda, and sweet tea.   Drink at least 1 glass of water with each meal and aim for at least 8 glasses per day  Exercise at least 150 minutes every week.

## 2023-01-13 NOTE — Assessment & Plan Note (Signed)
Likely has esophagitis or gastritis due to [facility] powder use as above.  Will be switching her omeprazole to Protonix.

## 2023-01-13 NOTE — Assessment & Plan Note (Signed)
Blood pressure at goal today on HCTZ 12.5 mg daily.

## 2023-01-13 NOTE — Assessment & Plan Note (Signed)
She has been under a lot more stress at home.  We did discuss referral to see therapist however she declined.  Not interested in medications at this point.  She will let us know if she changes her mind about either of these.

## 2023-01-13 NOTE — Assessment & Plan Note (Signed)
Weight has plateaued.  She is on mounjaro 15 mg weekly.  Tolerating well.  She we will continue to work on lifestyle modifications.

## 2023-01-13 NOTE — Progress Notes (Signed)
Karen Webster is a 62 y.o. female who presents today for an office visit.  Assessment/Plan:  New/Acute Problems: Right Groin Pain No red flags.  Pain elicited with internal/external rotation on exam and she likely has some underlying hip osteoarthritis.  Her abdominal exam was reassuring without any peritoneal signs or signs appendicitis.  Will be starting prednisone.  Will need to avoid NSAIDs due to concern for possible gastritis as below.  She will let us know if not improving in the next 1 to 2 weeks and refer to PT or sports medicine.  Tailbone Pain Consistent with coccydynia.  We discussed cushioning.  Will be starting prednisone as above which should help.  She will let us know if not proving and we can refer to PT or sports medicine.  Epigastric Pain Likely gastritis related to her BC powder use.  Her EKG today was reassuring and she has no other signs or symptoms consistent with cardiac etiology.  She will stay off of her BC powder and all NSAIDs.  Will start Protonix.  She will let us know if not proving in the next 1 to 2 weeks and would consider labs or imaging at that time.  We discussed reasons to return to care or seek emergent care.    Even though her overall risk for cardiac etiology is low we did also discuss that she has a strong family history and a few minor risk factors so would be reasonable to pursue exercise tolerance test at this time to get a baseline and definitively rule out cardiac etiology.  She is agreeable to this and we will place order today.   Chronic Problems Addressed Today: Essential hypertension Blood pressure at goal today on HCTZ 12.5 mg daily.  Gastroesophageal reflux disease without esophagitis Likely has esophagitis or gastritis due to [facility] powder use as above.  Will be switching her omeprazole to Protonix.  Morbid obesity (HCC) Weight has plateaued.  She is on mounjaro 15 mg weekly.  Tolerating well.  She we will continue to work on  lifestyle modifications.  Stress She has been under a lot more stress at home.  We did discuss referral to see therapist however she declined.  Not interested in medications at this point.  She will let us know if she changes her mind about either of these.     Subjective:  HPI:  See Assessment / plan for status of chronic conditions.  She has a few issues that she would like to discuss today.   Main concern today is right hip pain.No obvious injuries or precipitating events. Felt like a constant pain. Worse with walking. This has been improving the last few days. She has been taking BC powders for 4 times per day. Getting better the last few days.   She is also having some tailbone pain. This started a couple of months ago. No injuries or precipitating events. BC powders did not help. Worse when sitting on it.   She has also started having some heaviness in her upper abdomen and lower chest pain. Some dizziness when taking a deep breathing. She has been taking BC powders quite a bit recently. Got worse after drinking coffee this morning, otherwise has not noticed any difference with eating or drinking. No nausea or vomiting. She has been able to maintain her normal exertional levels without any exacerbation of symptoms.   She also does admit she has been under a lot of stress recently.  She is worried this may  be contributing to her symptoms.  Has a lot of stress due to home life.  She is not interested in medications or seeing a therapist at this point.       Objective:  Physical Exam: BP 124/80   Pulse 87   Temp (!) 97.3 F (36.3 C) (Temporal)   Ht 5\' 6"  (1.676 m)   Wt 200 lb (90.7 kg)   LMP 10/17/2021 (Exact Date) Comment: spotting  SpO2 95%   BMI 32.28 kg/m   Wt Readings from Last 3 Encounters:  01/13/23 200 lb (90.7 kg)  09/01/22 195 lb 9.6 oz (88.7 kg)  03/24/22 208 lb (94.3 kg)   Gen: No acute distress, resting comfortably CV: Regular rate and rhythm with no murmurs  appreciated Pulm: Normal work of breathing, clear to auscultation bilaterally with no crackles, wheezes, or rhonchi Abdomen: Mild tenderness palpation along epigastric area.  No rebound or guarding.  Bowel sounds present MUSCULOSKELETAL: Tenderness palpation along distal tailbone. - Right leg: No deformities.  Limited range of motion with internal and external range of motion.  This is elicits pain in her corresponding area.  Pain also elicited with FABER maneuver. Neuro: Grossly normal, moves all extremities Psych: Normal affect and thought content  EKG: Normal sinus rhythm.  No ischemic changes.  Time Spent: 45 minutes of total time was spent on the date of the encounter performing the following actions: chart review prior to seeing the patient, obtaining history, performing a medically necessary exam, counseling on the treatment plan, placing orders, and documenting in our EHR.  This does not include time spent reviewing above EKG.       Katina Degree. Jimmey Ralph, MD 01/13/2023 10:02 AM

## 2023-01-27 ENCOUNTER — Encounter: Payer: Self-pay | Admitting: Family Medicine

## 2023-01-27 ENCOUNTER — Telehealth: Payer: Self-pay | Admitting: Family Medicine

## 2023-01-27 MED ORDER — TIRZEPATIDE 15 MG/0.5ML ~~LOC~~ SOAJ: 15.0000 mg | SUBCUTANEOUS | 0 refills | Status: DC

## 2023-01-27 NOTE — Telephone Encounter (Signed)
Patient Name First: [facility] Last: Ste[place] Gender: Female DOB: 1961-01-13 Age: 61 Y 2 M 16 D Return Phone Number: [phone] (Primary) Address: City/ State/ Zip: [place]  [address] Client [facility], [facility] Provider Jacquiline Doe- MD Contact Type Call Who Is Calling Patient / Member / Family / Caregiver Call Type Triage / Clinical Relationship To Patient Self Return Phone Number [phone] (Primary) Chief Complaint CHEST PAIN - pain, pressure, heaviness or tightness Reason for Call Symptomatic / Request for Health Information Initial Comment Caller states calling from office pt c/o chest pains since last night, pain in right lower abdomen Translation No Nurse Assessment Nurse: Zena Amos, RN, Claris Che Date/Time (Eastern Time): 01/27/2023 1:22:26 PM Confirm and document reason for call. If symptomatic, describe symptoms. ---Caller states she has been having chest pain, feels heavy and goes into her lower right abdomen. Had a burning feeling on sunday. Caller was seen week and a half ago by Dr. Jimmey Ralph, had EKG and had steroids, no pain while on steroids. Came back without the medication. Does the patient have any new or worsening symptoms? ---Yes Will a triage be completed? ---Yes Related visit to physician within the last 2 weeks? ---Yes Does the PT have any chronic conditions? (i.e. diabetes, asthma, this includes High risk factors for pregnancy, etc.) ---No Is this a behavioral health or substance abuse call? ---No Guidelines Guideline Title Affirmed Question Affirmed Notes Nurse Date/Time Lamount Cohen Time) Chest Pain [1] Chest pain lasts < 5 minutes AND [2] NO chest pain or cardiac symptoms (e.g., breathing difficulty, sweating) Vassallo, RN, Claris Che 01/27/2023 1:25:01 PM  Guidelines Guideline Title Affirmed Question Affirmed Notes Nurse Date/Time  (Eastern Time) now (Exception: Chest pains that last only a few seconds.) Disp. Time Lamount Cohen Time) Disposition Final User 01/27/2023 1:21:11 PM Send to Urgent Queue Anastasio Champion 01/27/2023 1:30:47 PM See PCP within 24 Hours Yes Zena Amos, RN, Claris Che Final Disposition 01/27/2023 1:30:47 PM See PCP within 24 Hours Yes Vassallo, RN, Ruel Favors Disagree/Comply Comply Caller Understands Yes PreDisposition Call Doctor

## 2023-01-27 NOTE — Telephone Encounter (Signed)
FYI: This call has been transferred to triage nurse: the Triage Nurse. Once the result note has been entered staff can address the message at that time.  Patient called in with the following symptoms:  Red Word:chest pain and lower abdominal pain   Please advise at Mobile [phone] (mobile)  Message is routed to Provider Pool.

## 2023-01-27 NOTE — Telephone Encounter (Signed)
Not

## 2023-01-27 NOTE — Telephone Encounter (Signed)
Patient called stating triage informed her to be seen within 24 hours and call PCP office back for scheduling. I offered pt an appointment today with Dr. Herbie Drape @ 3:20 pm but she declined. Pt has been scheduled for 6/27 @ 8:30 am with Dr. Mardelle Matte. Awaiting triage.

## 2023-01-28 ENCOUNTER — Other Ambulatory Visit: Payer: Self-pay

## 2023-01-28 ENCOUNTER — Encounter: Payer: Self-pay | Admitting: Family Medicine

## 2023-01-28 ENCOUNTER — Encounter (HOSPITAL_BASED_OUTPATIENT_CLINIC_OR_DEPARTMENT_OTHER): Payer: Self-pay

## 2023-01-28 ENCOUNTER — Ambulatory Visit: Payer: 59 | Admitting: Family Medicine

## 2023-01-28 ENCOUNTER — Ambulatory Visit (HOSPITAL_BASED_OUTPATIENT_CLINIC_OR_DEPARTMENT_OTHER)
Admission: RE | Admit: 2023-01-28 | Discharge: 2023-01-28 | Disposition: A | Discharge status: Home / Self Care | Payer: 59 | Source: Ambulatory Visit | Attending: Family Medicine | Admitting: Family Medicine

## 2023-01-28 VITALS — BP 138/92 | HR 93 | Temp 98.7°F | Ht 66.0 in | Wt 200.0 lb

## 2023-01-28 DIAGNOSIS — R1013 Epigastric pain: Secondary | ICD-10-CM

## 2023-01-28 DIAGNOSIS — R1010 Upper abdominal pain, unspecified: Secondary | ICD-10-CM

## 2023-01-28 DIAGNOSIS — I10 Essential (primary) hypertension: Secondary | ICD-10-CM | POA: Diagnosis not present

## 2023-01-28 DIAGNOSIS — K2970 Gastritis, unspecified, without bleeding: Secondary | ICD-10-CM

## 2023-01-28 DIAGNOSIS — K219 Gastro-esophageal reflux disease without esophagitis: Secondary | ICD-10-CM | POA: Diagnosis not present

## 2023-01-28 LAB — HEPATIC FUNCTION PANEL
ALT: 8 U/L (ref 0–35)
AST: 10 U/L (ref 0–37)
Albumin: 4.1 g/dL (ref 3.5–5.2)
Alkaline Phosphatase: 54 U/L (ref 39–117)
Bilirubin, Direct: 0.1 mg/dL (ref 0.0–0.3)
Total Bilirubin: 0.7 mg/dL (ref 0.2–1.2)
Total Protein: 6.3 g/dL (ref 6.0–8.3)

## 2023-01-28 LAB — BASIC METABOLIC PANEL
BUN: 16 mg/dL (ref 6–23)
CO2: 30 mEq/L (ref 19–32)
Calcium: 9.6 mg/dL (ref 8.4–10.5)
Chloride: 102 mEq/L (ref 96–112)
Creatinine, Ser: 0.83 mg/dL (ref 0.40–1.20)
GFR: 74.61 mL/min (ref >60.00)
Glucose, Bld: 85 mg/dL (ref 70–99)
Potassium: 3.9 mEq/L (ref 3.5–5.1)
Sodium: 140 mEq/L (ref 135–145)

## 2023-01-28 LAB — CBC WITH DIFFERENTIAL/PLATELET
Basophils Absolute: 0.1 10*3/uL (ref 0.0–0.1)
Basophils Relative: 1 % (ref 0.0–3.0)
Eosinophils Absolute: 0.3 10*3/uL (ref 0.0–0.7)
Eosinophils Relative: 4 % (ref 0.0–5.0)
HCT: 40.5 % (ref 36.0–46.0)
Hemoglobin: 13.7 g/dL (ref 12.0–15.0)
Lymphocytes Relative: 27 % (ref 12.0–46.0)
Lymphs Abs: 1.9 10*3/uL (ref 0.7–4.0)
MCHC: 33.7 g/dL (ref 30.0–36.0)
MCV: 88.1 fl (ref 78.0–100.0)
Monocytes Absolute: 0.4 10*3/uL (ref 0.1–1.0)
Monocytes Relative: 5.9 % (ref 3.0–12.0)
Neutro Abs: 4.3 10*3/uL (ref 1.4–7.7)
Neutrophils Relative %: 62.1 % (ref 43.0–77.0)
Platelets: 287 10*3/uL (ref 150.0–400.0)
RBC: 4.6 Mil/uL (ref 3.87–5.11)
RDW: 13.1 % (ref 11.5–15.5)
WBC: 6.9 10*3/uL (ref 4.0–10.5)

## 2023-01-28 LAB — LIPASE: Lipase: 49 U/L (ref 11.0–59.0)

## 2023-01-28 LAB — H. PYLORI ANTIBODY, IGG: H Pylori IgG: NEGATIVE

## 2023-01-28 MED ORDER — PANTOPRAZOLE SODIUM 40 MG PO TBEC: 40.0000 mg | DELAYED_RELEASE_TABLET | Freq: Two times a day (BID) | ORAL | 3 refills | Status: DC

## 2023-01-28 MED ORDER — IOHEXOL 300 MG/ML  SOLN
100.0000 mL | Freq: Once | INTRAMUSCULAR | Status: AC | PRN
  Administered 2023-01-28: 80 mL via INTRAVENOUS

## 2023-01-28 NOTE — Progress Notes (Signed)
Subjective  CC:  Chief Complaint  Patient presents with   Abdominal Pain    Pain in the upper abdominal area under breast    Chest Pain    Pt stated that she started having chest pains over the weekend and on pain level 4.    Same day acute visit; PCP not available. New pt to me. Chart reviewed.   HPI: Karen Webster is a 62 y.o. female who presents to the office today to address the problems listed above in the chief complaint. 62 year old with hypertension who presented on June 14 with a multitude of symptoms including midepigastric pain, groin pain, shortness of breath.  I reviewed that note.  She was treated for possible hip arthritis with prednisone.  She had been taking BC powders for the pain and that was stopped.  Protonix 40 mg daily was started.  She did well until the prednisone taper ended.  Since, she has worsening midepigastric and upper abdominal pain.  Worse after eating, associated with pain with deep breathing and now pain radiates around to the back bilaterally.  Pain is constant.  No rash.  Mild nausea without vomiting or hematemesis.  Normal bowel movement without melena.  She is taking Protonix daily without improvement.  She is status postcholecystectomy.  She has been on [facility] and is currently on the 15 mg dose.  She denies fevers, chills, urinary symptoms, diarrhea.  She had a colonoscopy in 2019 but cannot remember her GI doctor.  No history of upper gastrointestinal problems.  No history of ulcers. Hypertension: Initially very elevated upon arrival.  I rechecked and it was improved.  Believe this is pain response.  Assessment  1. Upper abdominal pain   2. Midepigastric pain   3. Essential hypertension   4. Gastroesophageal reflux disease without esophagitis      Plan  Midepigastric and upper abdominal pain: Exam is significant for tenderness in the upper abdomen with some right upper quadrant guarding, no rebound.  Hypoactive bowel sounds.  Vital signs  are stable.  Differential diagnosis includes biliary tract disease, pancreatitis, peptic ulcer disease, gastritis.  Will check lab work and recommend stat abdominal CT for further clarification.  Pending results, treatment can be initiated versus GI referral for EGD.  Differential also includes other problems such as vascular disease, aortic disease etc.  I do not believe this is cardiac related.  If pain worsens, patient is instructed to go to the emergency room.  She understands and agrees with care plan.  Patient had normal EKG at her last visit. Hypertensive response due to pain.  Medical decision making: Reviewed old records, ordered medications and stat imaging studies, complex differential including possibly severe disease. Follow up: Pending results Visit date not found  Orders Placed This Encounter  Procedures   CT Abdomen Pelvis W Contrast   Basic metabolic panel   CBC with Differential/Platelet   H. pylori antibody, IgG- [facility]   Hepatic function panel   Lipase   Meds ordered this encounter  Medications   DISCONTD: pantoprazole (PROTONIX) 40 MG tablet    Sig: Take 1 tablet (40 mg total) by mouth 2 (two) times daily.    Dispense:  30 tablet    Refill:  3   pantoprazole (PROTONIX) 40 MG tablet    Sig: Take 1 tablet (40 mg total) by mouth 2 (two) times daily.    Dispense:  30 tablet    Refill:  3      I reviewed the  patients updated PMH, FH, and SocHx.    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   Cyanocobalamin (B-12 PO) Take 1 tablet by mouth daily.   hydrochlorothiazide (MICROZIDE) 12.5 MG capsule Take 1 capsule (12.5 mg total) by mouth daily.   tirzepatide (MOUNJARO) 15 MG/0.5ML Pen Inject 15 mg into the skin once a week.   [DISCONTINUED] pantoprazole (PROTONIX) 40 MG tablet Take 1 tablet (40 mg total) by mouth daily.    Allergies: Patient is allergic to nitrofurantoin, macrodantin [nitrofurantoin macrocrystal], tape, tetracycline, tetracyclines & related, and bactrim [sulfamethoxazole-trimethoprim]. Family History: Patient family history includes Atrial fibrillation in her father; Breast cancer in her paternal grandmother; Cancer in her paternal grandmother; Dementia in her maternal grandmother and mother; Heart attack in her brother; Heart disease in her maternal grandfather and paternal grandfather; Hypertension in her father and mother. Social History:  Patient  reports that she has never smoked. She has never used smokeless tobacco. She reports current alcohol use. She reports that she does not use drugs.  Review of Systems: Constitutional: Negative for fever malaise or anorexia Cardiovascular: negative for chest pain Respiratory: negative for SOB or persistent cough Gastrointestinal: negative for abdominal pain  Objective  Vitals: BP (!) 138/92 Comment: pt is in pain  Pulse 93   Temp 98.7 F (37.1 C)   Ht 5\' 6"  (1.676 m)   Wt 200 lb (90.7 kg)   LMP 10/17/2021 (Exact Date) Comment: spotting  SpO2 95%   BMI 32.28 kg/m  General: Nontoxic-appearing but mildly uncomfortable, alert and oriented x 3 HEENT: PEERL, conjunctiva normal, neck is supple Cardiovascular:  RRR without murmur or gallop.  Respiratory:  Good breath sounds bilaterally, CTAB with normal respiratory effort Abdomen: Hypoactive bowel sounds, soft, midepigastric tenderness, right upper quadrant and left upper quadrant tenderness, mild guarding without rebound.  No  masses or hepatosplenomegaly.  No lower abdominal tenderness, no suprapubic tenderness Skin:  Warm, no rashes  Commons side effects, risks, benefits, and alternatives for medications and treatment plan prescribed today were discussed, and the patient expressed understanding of the given instructions. Patient is instructed to call or message via MyChart if he/she has any questions or concerns regarding our treatment plan. No barriers to understanding were identified. We discussed Red Flag symptoms and signs in detail. Patient expressed understanding regarding what to do in case of urgent or emergency type symptoms.  Medication list was reconciled, printed and provided to the patient in AVS. Patient instructions and summary information was reviewed with the patient as documented in the AVS. This note was prepared with assistance of Dragon voice recognition software. Occasional wrong-word or sound-a-like substitutions may have occurred due to the inherent limitations of voice recognition software

## 2023-01-28 NOTE — Progress Notes (Signed)
See my chart note.

## 2023-01-28 NOTE — Telephone Encounter (Signed)
Noted  

## 2023-01-28 NOTE — Progress Notes (Signed)
Please call patient:all lab results and abd/pelvic CT results are normal.  This would make gastritis or peptic ulcer if possible likely diagnosis.  Please increase Protonix to twice daily as we discussed and and Mylanta or Maalox in between.  Also needs referral to GI for further evaluation.  Can ask her if she remembers her GI doctor or does she need a new referral and please place it.  To emergency room for severe pain.

## 2023-02-01 ENCOUNTER — Encounter: Payer: Self-pay | Admitting: Family Medicine

## 2023-02-02 ENCOUNTER — Other Ambulatory Visit: Payer: Self-pay | Admitting: Family Medicine

## 2023-02-02 ENCOUNTER — Other Ambulatory Visit: Payer: Self-pay

## 2023-02-02 DIAGNOSIS — R1010 Upper abdominal pain, unspecified: Secondary | ICD-10-CM

## 2023-02-02 DIAGNOSIS — K2970 Gastritis, unspecified, without bleeding: Secondary | ICD-10-CM

## 2023-02-11 ENCOUNTER — Ambulatory Visit: Payer: 59 | Admitting: Nurse Practitioner

## 2023-02-11 ENCOUNTER — Encounter: Payer: Self-pay | Admitting: Nurse Practitioner

## 2023-02-11 VITALS — BP 116/76 | HR 87 | Ht 67.0 in | Wt 203.0 lb

## 2023-02-11 DIAGNOSIS — R1013 Epigastric pain: Secondary | ICD-10-CM

## 2023-02-11 NOTE — Progress Notes (Addendum)
02/11/2023 Karen Webster [id number] February 15, 1961   CHIEF COMPLAINT: Upper abdominal pain   HISTORY OF PRESENT ILLNESS: Karen Webster is a 62 year old with past medical history of arthritis, hypertension uterine fibroids and GERD. She presents to our office today as referred by Dr. Asencion Webster for further evaluation regarding epigastrici pain. She developed epigastrici pain which radiated under the rib cage and sometimes had difficulty taking a deep breath. No chest pain. She also noted having RLQ pain which started about one month ago as well.  RUQ pain sometimes radiates to the shoulder blades which is similar to the pain she experienced prior to her cholecystectomy for gallstones 30 years ago.  She was seen by Dr. Durward Webster and she was prescribed Pantoprazole 40mg  bid which she started 2 weeks ago.  She is also taking Mylanta as needed with symptom relief.  She was prescribed Prednisone x 7 days for right hip pain and her RUQ pain abated without recurrence.  She endorses having a history of GERD for which she has taken Omeprazole for many years.  She may have undergone an EGD many years ago but she cannot recall.  She previously took ibuprofen 200 mg 4 tabs at bedtime for hip pain which she took for several years and she took [name] powder 4 packets daily for 3 to 4 months.  She stopped taking Ibuprofen and BC powder 2 weeks ago as advised by her PCP.  Family and work-related stress level significantly elevated.  She underwent a CTAP with contrast 01/28/2023, no evidence of acute intra-abdominal/pelvic pathology to explain her symptoms.  Labs 01/28/2023 showed a WBC 6.9.  Hemoglobin 13.7, normal LFTs and lipase level.  H. pylori IgG was normal.  She passes a bowel movement every 2 to 3 days.  No rectal bleeding or black stools.  She infrequently has hemorrhoidal discomfort without bleeding.  She believes she underwent a colonoscopy 5 years ago in [place] she was reported as normal, colonoscopy  was possibly done by [facility].  She started [facility] approximately 1 year ago for weight loss.      Latest Ref Rng & Units 01/28/2023    9:10 AM 09/01/2022    8:44 AM 03/24/2022   10:20 AM  CBC  WBC 4.0 - 10.5 K/uL 6.9  6.2    Hemoglobin 12.0 - 15.0 g/dL 53.6  64.4  03.4   Hematocrit 36.0 - 46.0 % 40.5  39.5  40.0   Platelets 150.0 - 400.0 K/uL 287.0  307.0         Latest Ref Rng & Units 01/28/2023    9:10 AM 09/01/2022    8:44 AM 03/24/2022    4:02 PM  CMP  Glucose 70 - 99 mg/dL 85  83  742   BUN 6 - 23 mg/dL 16  17  15    Creatinine 0.40 - 1.20 mg/dL 5.95  6.38  7.56   Sodium 135 - 145 mEq/L 140  140  136   Potassium 3.5 - 5.1 mEq/L 3.9  3.6  3.4   Chloride 96 - 112 mEq/L 102  100  96   CO2 19 - 32 mEq/L 30  32  27   Calcium 8.4 - 10.5 mg/dL 9.6  9.2  9.6   Total Protein 6.0 - 8.3 g/dL 6.3  6.5    Total Bilirubin 0.2 - 1.2 mg/dL 0.7  0.5    Alkaline Phos 39 - 117 U/L 54  57    AST 0 - 37  U/L 10  12    ALT 0 - 35 U/L 8  9      H. Pylori IgG negative on 01/28/2023.  CTAP 01/28/2023:  FINDINGS: Lower chest: Visualized lower lung fields are clear.   Hepatobiliary: Surgical clips are seen in gallbladder fossa. There is no dilation of bile ducts.   Pancreas: No focal abnormalities are seen.   Spleen: Unremarkable.   Adrenals/Urinary Tract: Adrenals are unremarkable. There is no hydronephrosis. There are no renal or ureteral stones. Urinary bladder is unremarkable.   Stomach/Bowel: Stomach is unremarkable. Small bowel loops are not dilated. The appendix is not distinctly seen. There is no pericecal inflammation. There is no significant wall thickening in colon. There is no pericolic stranding.   Vascular/Lymphatic: Scattered arterial calcifications are seen.   Reproductive: Uterus is enlarged with inhomogeneous attenuation in myometrium suggesting fibroids. Coarse calcification is seen in fibroid. No adnexal masses are seen.   Other: There is no ascites or  pneumoperitoneum. Small umbilical hernia containing fat is seen.   Musculoskeletal: Lumbar spondylosis, more so at L4-L5 level with mild spinal stenosis and narrowing of neural foramina.   IMPRESSION: There is no evidence of intestinal obstruction or pneumoperitoneum. There is no hydronephrosis.   Enlarged uterus with multiple fibroids.  Lumbar spondylosis.   Past Medical History:  Diagnosis Date   Arthritis    knees   GERD (gastroesophageal reflux disease)    Hypertension    Seasonal allergies    SUI (stress urinary incontinence, female)    Past Surgical History:  Procedure Laterality Date   CATARACT EXTRACTION, BILATERAL     ~12 yrs ago   CHOLECYSTECTOMY     ~31 yrs ago   COLONOSCOPY  2018   DILATATION & CURETTAGE/HYSTEROSCOPY WITH MYOSURE N/A 03/24/2022   Procedure: DILATATION & CURETTAGE/HYSTEROSCOPY WITH MYOSURE;  Surgeon: Karen Del, MD;  Location: [facility];  Service: Gynecology;  Laterality: N/A;   HYSTEROSCOPY N/A 11/17/2017   Procedure: HYSTEROSCOPY FOR REMOVAL OF IUD;  Surgeon: Karen Del, MD;  Location: [facility];  Service: Gynecology;  Laterality: N/A;   KNEE SURGERY     Right knee, Dr Karen Webster   NASAL SEPTUM SURGERY     ~35 yrs ago   WISDOM TOOTH EXTRACTION     Late 8s per pt   Social History: She is widowed.  She has 1 daughter.  She works in Engineering geologist.  Non-smoker.  Occasional alcohol use. No drug use.   Family History: family history includes Atrial fibrillation in her father; Breast cancer in her paternal grandmother; Cancer in her paternal grandmother; Dementia in her maternal grandmother and mother; Heart attack in her brother; Heart disease in her maternal grandfather and paternal grandfather; Hypertension in her father and mother.  Allergies  Allergen Reactions   Nitrofurantoin Hives and Rash   Macrodantin [Nitrofurantoin Macrocrystal] Hives   Tape     "Makes skin raw" - paper tape  better   Tetracycline Hives   Tetracyclines & Related Hives   Bactrim [Sulfamethoxazole-Trimethoprim] Rash      Outpatient Encounter Medications as of 02/11/2023  Medication Sig   Cholecalciferol (VITAMIN D3 PO) Take 1,000 Units by mouth daily.   Cyanocobalamin (B-12 PO) Take 1 tablet by mouth daily.   hydrochlorothiazide (MICROZIDE) 12.5 MG capsule Take 1 capsule (12.5 mg total) by mouth daily.   pantoprazole (PROTONIX) 40 MG tablet Take 1 tablet (40 mg total) by mouth 2 (two) times daily.   tirzepatide [facility]) 15 MG/0.5ML Pen Inject  15 mg into the skin once a week.   No facility-administered encounter medications on file as of 02/11/2023.    REVIEW OF SYSTEMS:  Gen: Denies fever, sweats or chills. No unintentional weight loss.  CV: Denies chest pain, palpitations or edema. Resp: Denies cough, shortness of breath of hemoptysis.  GI: See HPI. GU : Denies urinary burning, blood in urine, increased urinary frequency or incontinence. MS: Denies joint pain, muscles aches or weakness. Derm: Denies rash, itchiness, skin lesions or unhealing ulcers. Psych: Denies depression, anxiety, memory loss or confusion. Heme: Denies bruising, easy bleeding. Neuro:  Denies headaches, dizziness or paresthesias. Endo:  Denies any problems with DM, thyroid or adrenal function.  PHYSICAL EXAM: BP 116/76   Pulse 87   Ht 5\' 7"  (1.702 m)   Wt 203 lb (92.1 kg)   LMP 10/17/2021 (Exact Date) Comment: spotting  SpO2 96%   BMI 31.79 kg/m   LMP 10/17/2021 (Exact Date) Comment: spotting General: 62 year old female in no acute distress. Head: Normocephalic and atraumatic. Eyes:  Sclerae non-icteric, conjunctive pink. Ears: Normal auditory acuity. Mouth: Dentition intact. No ulcers or lesions.  Neck: Supple, no lymphadenopathy or thyromegaly.  Lungs: Clear bilaterally to auscultation without wheezes, crackles or rhonchi. Heart: Regular rate and rhythm. No murmur, rub or gallop appreciated.  Abdomen:  Soft, nondistended.  Mild epigastric and RUQ tenderness without rebound or guarding.  No masses. No hepatosplenomegaly. Normoactive bowel sounds x 4 quadrants.  Rectal: Deferred.  Musculoskeletal: Symmetrical with no gross deformities. Skin: Warm and dry. No rash or lesions on visible extremities. Extremities: No edema. Neurological: Alert oriented x 4, no focal deficits.  Psychological:  Alert and cooperative. Normal mood and affect.  ASSESSMENT AND PLAN:  62 year old female with a history of GERD on chronic PPI and chronic NSAID use with epigastric pain x 1 months. Omeprazole changed to  Pantoprazole 40mg  bid 2 weeks ago with slight improvement. Stopped Ibuprofen and BC powder 2 weeks ago.  -Continue Pantoprazole 40 mg twice daily -GERD diet -No NSAIDs i.e. Ibuprofen/BC powder -EGD benefits and risks discussed including risk with sedation, risk of bleeding, perforation and infection  -Patient to contact office if symptoms worsen -Patient was instructed to hold Mounjaro for 7 days prior to her EGD date  S/P cholecystectomy secondary to gallstones thirty years ago.  Epigastric pain sometimes radiates to the shoulder blades.  CTAP without evidence of biliary ductal dilatation.  Normal LFTs and lipase level. -Consider abdominal MRI/MRCP if EGD unrevealing  Colon cancer screening.  Patient endorsed completing a colonoscopy around 5 years ago, results not available in care everywhere or epic. -Colonoscopy was possibly done by [facility] as the patient has not been seen previously in our office and was not seen by Dr. Loreta Ave or Dr. Elnoria Howard.   -Request copy of colonoscopy procedure report from [facility], if not obtained, patient will need to keep these records -To verify colonoscopy recall date after colonoscopy records received and reviewed  RLQ pain abated after she took a course of Prednisone for suspected right hip arthritis.  CTAP 01/28/2023 findings did not explain her symptoms. -Patient to contact  office if RLQ pain recurs  ADDENDUM: Records from [facility] received: Colonoscopy 12/11/2016 by Dr. Dorena Cookey: The entire examined colon is normal Biopsies were taken with cold forceps from the right colon for evaluation of microscopic colitis Path report negative for lymphocytic/collagenous colitis, active colitis or dysplasia Recall colonoscopy 10 years      CC:  Willow Ora,  MD

## 2023-02-11 NOTE — Patient Instructions (Signed)
You have been scheduled for an endoscopy. Please follow written instructions given to you at your visit today.  If you use inhalers (even only as needed), please bring them with you on the day of your procedure.  If you take any of the following medications, they will need to be adjusted prior to your procedure:   DO NOT TAKE 7 DAYS PRIOR TO TEST- Trulicity (dulaglutide) Ozempic, Wegovy (semaglutide) Mounjaro (tirzepatide) Bydureon Bcise (exanatide extended release)  DO NOT TAKE 1 DAY PRIOR TO YOUR TEST Rybelsus (semaglutide) Adlyxin (lixisenatide) Victoza (liraglutide) Byetta (exanatide) ___________________________________________________________________________  Continue Omeprazole  No NSAIDS. No Ibuprofen, Goody powder.  Due to recent changes in healthcare laws, you may see the results of your imaging and laboratory studies on MyChart before your provider has had a chance to review them.  We understand that in some cases there may be results that are confusing or concerning to you. Not all laboratory results come back in the same time frame and the provider may be waiting for multiple results in order to interpret others.  Please give [name] 48 hours in order for your provider to thoroughly review all the results before contacting the office for clarification of your results.   Thank you for trusting me with your gastrointestinal care!   Alcide Evener, CRNP

## 2023-03-07 ENCOUNTER — Other Ambulatory Visit: Payer: Self-pay | Admitting: Family Medicine

## 2023-03-09 ENCOUNTER — Telehealth: Payer: Self-pay

## 2023-03-09 NOTE — Telephone Encounter (Signed)
Message Received: Percell Locus, Karen Carl, NP  Karen Darling, RN Karen Webster, pls enter recall colonoscopy due 12/2026     Colonoscopy recall placed for 12/2026.

## 2023-03-11 NOTE — Progress Notes (Signed)
Agree with the assessment and plan as outlined by Colleen Kennedy-Smith, NP.   Jasneet Schobert, DO, FACG [facility]   

## 2023-03-16 ENCOUNTER — Encounter: Payer: Self-pay | Admitting: Gastroenterology

## 2023-03-23 ENCOUNTER — Other Ambulatory Visit: Payer: Self-pay

## 2023-03-23 ENCOUNTER — Telehealth: Payer: Self-pay | Admitting: Nurse Practitioner

## 2023-03-23 NOTE — Telephone Encounter (Signed)
Pt questioned when she should stop the Monjoro. Pt stated that she stopped last Wednesday. Prep instructions were sent once again to pt My Chart. Pt made aware. Pt verbalized understanding with all questions answered.

## 2023-03-23 NOTE — Telephone Encounter (Signed)
PT is having an EGD 8/23 and wants to know was she to stop taking Mounjaro for this procedure. Please advise

## 2023-03-26 ENCOUNTER — Ambulatory Visit (AMBULATORY_SURGERY_CENTER): Payer: 59 | Admitting: Gastroenterology

## 2023-03-26 ENCOUNTER — Encounter: Payer: Self-pay | Admitting: Gastroenterology

## 2023-03-26 VITALS — BP 124/78 | HR 78 | Temp 98.4°F | Resp 17 | Ht 67.0 in | Wt 203.0 lb

## 2023-03-26 DIAGNOSIS — K219 Gastro-esophageal reflux disease without esophagitis: Secondary | ICD-10-CM

## 2023-03-26 DIAGNOSIS — R12 Heartburn: Secondary | ICD-10-CM

## 2023-03-26 DIAGNOSIS — K2970 Gastritis, unspecified, without bleeding: Secondary | ICD-10-CM

## 2023-03-26 DIAGNOSIS — R1013 Epigastric pain: Secondary | ICD-10-CM

## 2023-03-26 DIAGNOSIS — K2950 Unspecified chronic gastritis without bleeding: Secondary | ICD-10-CM

## 2023-03-26 MED ORDER — PANTOPRAZOLE SODIUM 40 MG PO TBEC: 40.0000 mg | DELAYED_RELEASE_TABLET | Freq: Two times a day (BID) | ORAL | 3 refills | Status: DC

## 2023-03-26 MED ORDER — SODIUM CHLORIDE 0.9 % IV SOLN
500.0000 mL | Freq: Once | INTRAVENOUS | Status: DC
Start: 2023-03-26 — End: 2023-03-26

## 2023-03-26 NOTE — Progress Notes (Signed)
Called to room to assist during endoscopic procedure.  Patient ID and intended procedure confirmed with present staff. Received instructions for my participation in the procedure from the performing physician.  

## 2023-03-26 NOTE — Progress Notes (Signed)
VS by SM.

## 2023-03-26 NOTE — Progress Notes (Signed)
GASTROENTEROLOGY PROCEDURE H&P NOTE   Primary Care Physician: Ardith Dark, MD    Reason for Procedure:   MEG pain, GERD  Plan:    EGD  Patient is appropriate for endoscopic procedure(s) in the ambulatory ([facility]) setting.  The nature of the procedure, as well as the risks, benefits, and alternatives were carefully and thoroughly reviewed with the patient. Ample time for discussion and questions allowed. The patient understood, was satisfied, and agreed to proceed.     HPI: Karen Webster is a 62 y.o. female who presents for EGD for evaluation of epigastric pain and GERD. Recent CT was unremarkable. Increased PPI from omeprazole 20 mg daily to Protonix 40 mg BID with overall improvement, but still with dyspepsia and episodic heartburn.    Past Medical History:  Diagnosis Date   Arthritis    knees   GERD (gastroesophageal reflux disease)    Hypertension    Seasonal allergies    SUI (stress urinary incontinence, female)     Past Surgical History:  Procedure Laterality Date   CATARACT EXTRACTION, BILATERAL     ~12 yrs ago   CHOLECYSTECTOMY     ~31 yrs ago   COLONOSCOPY  2018   DILATATION & CURETTAGE/HYSTEROSCOPY WITH MYOSURE N/A 03/24/2022   Procedure: DILATATION & CURETTAGE/HYSTEROSCOPY WITH MYOSURE;  Surgeon: Genia Del, MD;  Location: [facility];  Service: Gynecology;  Laterality: N/A;   HYSTEROSCOPY N/A 11/17/2017   Procedure: HYSTEROSCOPY FOR REMOVAL OF IUD;  Surgeon: Genia Del, MD;  Location: [facility];  Service: Gynecology;  Laterality: N/A;   KNEE SURGERY     Right knee, Dr Antony Odea   NASAL SEPTUM SURGERY     ~35 yrs ago   WISDOM TOOTH EXTRACTION     Late 20s per pt    Prior to Admission medications   Medication Sig Start Date End Date Taking? Authorizing Provider  Cholecalciferol (VITAMIN D3 PO) Take 1,000 Units by mouth daily.   Yes [provider]  Cyanocobalamin (B-12 PO) Take 1 tablet by  mouth daily.   Yes [provider]  hydrochlorothiazide (MICROZIDE) 12.5 MG capsule Take 1 capsule (12.5 mg total) by mouth daily. 09/25/22  Yes Ardith Dark, MD  pantoprazole (PROTONIX) 40 MG tablet TAKE 1 TABLET BY MOUTH EVERY DAY 03/08/23  Yes Ardith Dark, MD  tirzepatide [facility]) 15 MG/0.5ML Pen INJECT 15 MG INTO THE SKIN ONCE A WEEK. 03/08/23   Ardith Dark, MD    Current Outpatient Medications  Medication Sig Dispense Refill   Cholecalciferol (VITAMIN D3 PO) Take 1,000 Units by mouth daily.     Cyanocobalamin (B-12 PO) Take 1 tablet by mouth daily.     hydrochlorothiazide (MICROZIDE) 12.5 MG capsule Take 1 capsule (12.5 mg total) by mouth daily. 90 capsule 3   pantoprazole (PROTONIX) 40 MG tablet TAKE 1 TABLET BY MOUTH EVERY DAY 90 tablet 1   tirzepatide (MOUNJARO) 15 MG/0.5ML Pen INJECT 15 MG INTO THE SKIN ONCE A WEEK. 1 mL 1   Current Facility-Administered Medications  Medication Dose Route Frequency Provider Last Rate Last Admin   0.9 %  sodium chloride infusion  500 mL Intravenous Once Kristen Fromm V, DO        Allergies as of 03/26/2023 - Review Complete 03/26/2023  Allergen Reaction Noted   Nitrofurantoin Hives and Rash 04/01/2012   Macrodantin [nitrofurantoin macrocrystal] Hives 10/26/2017   Tape  03/24/2022   Tetracycline Hives 08/06/2014   Tetracyclines & related Hives 10/26/2017  Bactrim [sulfamethoxazole-trimethoprim] Rash 10/26/2017    Family History  Problem Relation Age of Onset   Dementia Mother    Hypertension Mother    Hypertension Father    Atrial fibrillation Father    Heart attack Brother    Dementia Maternal Grandmother    Heart disease Maternal Grandfather    Breast cancer Paternal Grandmother    Cancer Paternal Grandmother    Heart disease Paternal Grandfather    Colon cancer Neg Hx    Esophageal cancer Neg Hx    Stomach cancer Neg Hx    Rectal cancer Neg Hx     Social History   Socioeconomic History   Marital status:  Widowed    Spouse name: Not on file   Number of children: Not on file   Years of education: Not on file   Highest education level: 12th grade  Occupational History   Not on file  Tobacco Use   Smoking status: Never   Smokeless tobacco: Never  Vaping Use   Vaping status: Never Used  Substance and Sexual Activity   Alcohol use: Yes    Comment: SOCIAL    Drug use: Never   Sexual activity: Not Currently    Partners: Male    Birth control/protection: Post-menopausal    Comment: 1st intercourse- 16, partners-more than 5  Other Topics Concern   Not on file  Social History Narrative   Not on file   Social Determinants of Health   Financial Resource Strain: Low Risk  (01/12/2023)   Overall Financial Resource Strain (CARDIA)    Difficulty of Paying Living Expenses: Not hard at all  Food Insecurity: No Food Insecurity (01/12/2023)   Hunger Vital Sign    Worried About Running Out of Food in the Last Year: Never true    Ran Out of Food in the Last Year: Never true  Transportation Needs: No Transportation Needs (01/12/2023)   PRAPARE - Administrator, Civil Service (Medical): No    Lack of Transportation (Non-Medical): No  Physical Activity: Unknown (01/12/2023)   Exercise Vital Sign    Days of Exercise per Week: Patient declined    Minutes of Exercise per Session: Not on file  Stress: No Stress Concern Present (01/12/2023)   [employer] of Occupational Health - Occupational Stress Questionnaire    Feeling of Stress : Only a little  Social Connections: Moderately Isolated (01/12/2023)   Social Connection and Isolation Panel [NHANES]    Frequency of Communication with Friends and Family: More than three times a week    Frequency of Social Gatherings with Friends and Family: Twice a week    Attends Religious Services: 1 to 4 times per year    Active Member of [employer] or Organizations: No    Attends Banker Meetings: Not on file    Marital Status: Widowed   Catering manager Violence: Not on file    Physical Exam: Vital signs in last 24 hours: @BP  (!) 142/92   Pulse 63   Temp 98.4 F (36.9 C) (Skin)   Resp 14   Ht 5\' 7"  (1.702 m)   Wt 203 lb (92.1 kg)   LMP 10/17/2021 (Exact Date) Comment: spotting  SpO2 98%   BMI 31.79 kg/m  GEN: NAD EYE: Sclerae anicteric ENT: MMM CV: Non-tachycardic Pulm: CTA b/l GI: Soft, NT/ND NEURO:  Alert & Oriented x 3   Doristine Locks, DO [facility]   03/26/2023 3:14 PM

## 2023-03-26 NOTE — Op Note (Signed)
[facility] Patient Name: Karen Webster Procedure Date: 03/26/2023 3:06 PM MRN: [id number] Endoscopist: Doristine Locks , MD, [id number] Age: 62 Referring MD:  Date of Birth: [birth year] Gender: Female Account #: [id number] Procedure:                Upper GI endoscopy Indications:              Epigastric abdominal pain, Heartburn, Suspected                            esophageal reflux                           Increased PPI from omeprazole 20 mg daily last                            month to Protonix 40 mg BID with overall                            improvement, but still with dyspepsia and episodic                            heartburn. Medicines:                Monitored Anesthesia Care Procedure:                Pre-Anesthesia Assessment:                           - Prior to the procedure, a History and Physical                            was performed, and patient medications and                            allergies were reviewed. The patient's tolerance of                            previous anesthesia was also reviewed. The risks                            and benefits of the procedure and the sedation                            options and risks were discussed with the patient.                            All questions were answered, and informed consent                            was obtained. Prior Anticoagulants: The patient has                            taken no anticoagulant or antiplatelet agents. ASA  Grade Assessment: II - A patient with mild systemic                            disease. After reviewing the risks and benefits,                            the patient was deemed in satisfactory condition to                            undergo the procedure.                           After obtaining informed consent, the endoscope was                            passed under direct vision. Throughout the                            procedure,  the patient's blood pressure, pulse, and                            oxygen saturations were monitored continuously. The                            Olympus Scope [id number] was introduced through the                            mouth, and advanced to the second part of duodenum.                            The upper GI endoscopy was accomplished without                            difficulty. The patient tolerated the procedure                            well. Scope In: Scope Out: Findings:                 The examined esophagus was normal.                           The Z-line was regular and was found 39 cm from the                            incisors.                           The gastroesophageal flap valve was visualized                            endoscopically and classified as Hill Grade II                            (fold present, opens with respiration).  Mild inflammation characterized by congestion                            (edema) and erythema was found in the gastric                            fundus and in the gastric body. Biopsies were taken                            with a cold forceps for histology and Helicobacter                            pylori testing. Estimated blood loss was minimal.                            The remainder of the stomach was normal appearing.                           The examined duodenum was normal. Complications:            No immediate complications. Estimated Blood Loss:     Estimated blood loss was minimal. Impression:               - Normal esophagus.                           - Z-line regular, 39 cm from the incisors.                           - Gastroesophageal flap valve classified as Hill                            Grade II (fold present, opens with respiration).                           - Mild, non-ulcer gastritis. Biopsied.                           - Normal examined duodenum. Recommendation:           -  Patient has a contact number available for                            emergencies. The signs and symptoms of potential                            delayed complications were discussed with the                            patient. Return to normal activities tomorrow.                            Written discharge instructions were provided to the                            patient.                           -  Resume previous diet.                           - Continue present medications.                           - Continue Protonix as prescribed, taken 30-60                            minutes prior to breakfast and dinner.                           - Await pathology results.                           - Follow-up with Alcide Evener in the GI                            clinic PRN. Doristine Locks, MD 03/26/2023 3:36:49 PM

## 2023-03-26 NOTE — Progress Notes (Signed)
Report to PACU, RN, vss, BBS= Clear.  

## 2023-03-26 NOTE — Patient Instructions (Signed)
Resume previous diet and medications. Continue Protonix as prescribed, taken 30-60 minutes prior to breakfast and dinner. Awaiting pathology reuslts. Follow up with Alcide Evener in the GI clinic as needed.  YOU HAD AN ENDOSCOPIC PROCEDURE TODAY AT THE [facility]:   Refer to the procedure report that was given to you for any specific questions about what was found during the examination.  If the procedure report does not answer your questions, please call your gastroenterologist to clarify.  If you requested that your care partner not be given the details of your procedure findings, then the procedure report has been included in a sealed envelope for you to review at your convenience later.  YOU SHOULD EXPECT: Some feelings of bloating in the abdomen. Passage of more gas than usual.  Walking can help get rid of the air that was put into your GI tract during the procedure and reduce the bloating. If you had a lower endoscopy (such as a colonoscopy or flexible sigmoidoscopy) you may notice spotting of blood in your stool or on the toilet paper. If you underwent a bowel prep for your procedure, you may not have a normal bowel movement for a few days.  Please Note:  You might notice some irritation and congestion in your nose or some drainage.  This is from the oxygen used during your procedure.  There is no need for concern and it should clear up in a day or so.  SYMPTOMS TO REPORT IMMEDIATELY:  Following upper endoscopy (EGD)  Vomiting of blood or coffee ground material  New chest pain or pain under the shoulder blades  Painful or persistently difficult swallowing  New shortness of breath  Fever of 100F or higher  Black, tarry-looking stools  For urgent or emergent issues, a gastroenterologist can be reached at any hour by calling [phone]. Do not use MyChart messaging for urgent concerns.    DIET:  We do recommend a small meal at first, but then you may proceed to  your regular diet.  Drink plenty of fluids but you should avoid alcoholic beverages for 24 hours.  ACTIVITY:  You should plan to take it easy for the rest of today and you should NOT DRIVE or use heavy machinery until tomorrow (because of the sedation medicines used during the test).    FOLLOW UP: Our staff will call the number listed on your records the next business day following your procedure.  We will call around 7:15- 8:00 am to check on you and address any questions or concerns that you may have regarding the information given to you following your procedure. If we do not reach you, we will leave a message.     If any biopsies were taken you will be contacted by phone or by letter within the next 1-3 weeks.  Please call us at [phone] if you have not heard about the biopsies in 3 weeks.    SIGNATURES/CONFIDENTIALITY: You and/or your care partner have signed paperwork which will be entered into your electronic medical record.  These signatures attest to the fact that that the information above on your After Visit Summary has been reviewed and is understood.  Full responsibility of the confidentiality of this discharge information lies with you and/or your care-partner.

## 2023-03-29 ENCOUNTER — Telehealth: Payer: Self-pay | Admitting: *Deleted

## 2023-03-29 NOTE — Telephone Encounter (Signed)
  Follow up Call-     03/26/2023    2:41 PM  Call back number  Post procedure Call Back phone  # [phone]  Permission to leave phone message Yes     Patient questions:  Do you have a fever, pain , or abdominal swelling? No. Pain Score  0 *  Have you tolerated food without any problems? Yes.    Have you been able to return to your normal activities? Yes.    Do you have any questions about your discharge instructions: Diet   No. Medications  No. Follow up visit  No.  Do you have questions or concerns about your Care? No. Pt c/o mild intermittent burning in her stomach. States she is taking the Protonix BID AC. Pt is not concerned at this time. Awaiting gastric biopsy results.   Actions: * If pain score is 4 or above: No action needed, pain <4.

## 2023-04-01 ENCOUNTER — Other Ambulatory Visit: Payer: Self-pay | Admitting: Family Medicine

## 2023-04-09 ENCOUNTER — Telehealth: Payer: Self-pay | Admitting: Gastroenterology

## 2023-04-09 NOTE — Telephone Encounter (Signed)
Inbound call from patient requesting a call to discuss 8/23 lab results. States she received results in [place] but does not understand and is concerned. Advised patient results look like that have not been reviewed just yet. Please advise, thank you.

## 2023-04-09 NOTE — Telephone Encounter (Signed)
Advised patient that path returned 04/05/23 and Dr Barron Alvine will review as soon as possible. Advised we will give her a phone call or send a letter with official results of pathology and any recommendations. She verbalizes understanding.

## 2023-04-12 ENCOUNTER — Ambulatory Visit: Payer: 59 | Admitting: Gastroenterology

## 2023-05-19 ENCOUNTER — Other Ambulatory Visit: Payer: Self-pay | Admitting: *Deleted

## 2023-05-19 MED ORDER — MOUNJARO 15 MG/0.5ML ~~LOC~~ SOAJ: 15.0000 mg | SUBCUTANEOUS | 0 refills | Status: DC

## 2023-05-25 ENCOUNTER — Other Ambulatory Visit: Payer: Self-pay | Admitting: Family Medicine

## 2023-06-23 ENCOUNTER — Encounter: Payer: Self-pay | Admitting: Family Medicine

## 2023-06-23 ENCOUNTER — Other Ambulatory Visit: Payer: Self-pay | Admitting: *Deleted

## 2023-06-23 MED ORDER — MOUNJARO 15 MG/0.5ML ~~LOC~~ SOAJ: 15.0000 mg | SUBCUTANEOUS | 0 refills | Status: DC

## 2023-06-23 NOTE — Telephone Encounter (Signed)
Rx was send to [facility] pharmacy  ?

## 2023-08-01 ENCOUNTER — Other Ambulatory Visit: Payer: Self-pay | Admitting: Family Medicine

## 2023-08-29 ENCOUNTER — Other Ambulatory Visit: Payer: Self-pay | Admitting: Family Medicine

## 2023-09-08 ENCOUNTER — Encounter: Payer: Self-pay | Admitting: Family Medicine

## 2023-09-08 ENCOUNTER — Ambulatory Visit (INDEPENDENT_AMBULATORY_CARE_PROVIDER_SITE_OTHER): Payer: 59 | Admitting: Family Medicine

## 2023-09-08 VITALS — BP 103/73 | HR 91 | Temp 97.3°F | Ht 67.0 in | Wt 187.0 lb

## 2023-09-08 DIAGNOSIS — K219 Gastro-esophageal reflux disease without esophagitis: Secondary | ICD-10-CM

## 2023-09-08 DIAGNOSIS — Z0001 Encounter for general adult medical examination with abnormal findings: Secondary | ICD-10-CM

## 2023-09-08 DIAGNOSIS — R739 Hyperglycemia, unspecified: Secondary | ICD-10-CM | POA: Diagnosis not present

## 2023-09-08 DIAGNOSIS — F321 Major depressive disorder, single episode, moderate: Secondary | ICD-10-CM

## 2023-09-08 DIAGNOSIS — I10 Essential (primary) hypertension: Secondary | ICD-10-CM

## 2023-09-08 DIAGNOSIS — E785 Hyperlipidemia, unspecified: Secondary | ICD-10-CM

## 2023-09-08 LAB — LIPID PANEL
Cholesterol: 148 mg/dL (ref 0–200)
HDL: 35.4 mg/dL — ABNORMAL LOW (ref >39.00)
LDL Cholesterol: 93 mg/dL (ref 0–99)
NonHDL: 112.34
Total CHOL/HDL Ratio: 4
Triglycerides: 96 mg/dL (ref 0.0–149.0)
VLDL: 19.2 mg/dL (ref 0.0–40.0)

## 2023-09-08 LAB — COMPREHENSIVE METABOLIC PANEL
ALT: 22 U/L (ref 0–35)
AST: 22 U/L (ref 0–37)
Albumin: 4.1 g/dL (ref 3.5–5.2)
Alkaline Phosphatase: 50 U/L (ref 39–117)
BUN: 11 mg/dL (ref 6–23)
CO2: 31 meq/L (ref 19–32)
Calcium: 8.7 mg/dL (ref 8.4–10.5)
Chloride: 100 meq/L (ref 96–112)
Creatinine, Ser: 0.83 mg/dL (ref 0.40–1.20)
GFR: 74.29 mL/min (ref >60.00)
Glucose, Bld: 80 mg/dL (ref 70–99)
Potassium: 2.9 meq/L — ABNORMAL LOW (ref 3.5–5.1)
Sodium: 141 meq/L (ref 135–145)
Total Bilirubin: 0.5 mg/dL (ref 0.2–1.2)
Total Protein: 6.7 g/dL (ref 6.0–8.3)

## 2023-09-08 LAB — CBC
HCT: 38.9 % (ref 36.0–46.0)
Hemoglobin: 13.6 g/dL (ref 12.0–15.0)
MCHC: 35 g/dL (ref 30.0–36.0)
MCV: 87.1 fL (ref 78.0–100.0)
Platelets: 264 10*3/uL (ref 150.0–400.0)
RBC: 4.46 Mil/uL (ref 3.87–5.11)
RDW: 12.5 % (ref 11.5–15.5)
WBC: 4.3 10*3/uL (ref 4.0–10.5)

## 2023-09-08 LAB — TSH: TSH: 1.01 u[IU]/mL (ref 0.35–5.50)

## 2023-09-08 LAB — HEMOGLOBIN A1C: Hgb A1c MFr Bld: 5.1 % (ref 4.6–6.5)

## 2023-09-08 MED ORDER — HYDROCHLOROTHIAZIDE 12.5 MG PO TABS
6.2500 mg | ORAL_TABLET | Freq: Every day | ORAL | 3 refills | Status: DC
Start: 2023-09-08 — End: 2023-11-22

## 2023-09-08 MED ORDER — AZITHROMYCIN 250 MG PO TABS: ORAL_TABLET | ORAL | 0 refills | Status: DC

## 2023-09-08 MED ORDER — PANTOPRAZOLE SODIUM 40 MG PO TBEC: 40.0000 mg | DELAYED_RELEASE_TABLET | Freq: Two times a day (BID) | ORAL | 3 refills | Status: DC

## 2023-09-08 NOTE — Assessment & Plan Note (Signed)
 Check A1c.

## 2023-09-08 NOTE — Assessment & Plan Note (Signed)
 Has seen GI for this in the past.  We will continue Protonix  40 mg twice daily.

## 2023-09-08 NOTE — Assessment & Plan Note (Signed)
 She is down about 16 pounds since our last visit.  She is still working on diet.  We did discuss importance of exercise as well.  She will continue Mounjaro  15 mg weekly.  She is tolerating this well.  She would like to maintain her current weight.

## 2023-09-08 NOTE — Assessment & Plan Note (Signed)
 Blood pressure very well-controlled today.  She has lost quite a bit of weight since her last visit.  We did discuss stopping HCTZ completely though she is still having some lower extremity edema and would like to still have some HCTZ on board.  We will decrease dose to 6.25 mg daily.  She will follow-up with us  in a few weeks via MyChart.

## 2023-09-08 NOTE — Progress Notes (Signed)
 Chief Complaint:  Karen Webster is a 63 y.o. female who presents today for her annual comprehensive physical exam.    Assessment/Plan:  New/Acute Problems: Sinusitis  Improving with over-the-counter meds.  Will send in pocket prescription for azithromycin  with instruction to not start unless symptoms fail to improve over the next several days.  Chronic Problems Addressed Today: Essential hypertension Blood pressure very well-controlled today.  She has lost quite a bit of weight since her last visit.  We did discuss stopping HCTZ completely though she is still having some lower extremity edema and would like to still have some HCTZ on board.  We will decrease dose to 6.25 mg daily.  She will follow-up with us  in a few weeks via MyChart.  Gastroesophageal reflux disease without esophagitis Has seen GI for this in the past.  We will continue Protonix  40 mg twice daily.  Hyperglycemia Check A1c.  Dyslipidemia Check lipids.  Morbid obesity (HCC) She is down about 16 pounds since our last visit.  She is still working on diet.  We did discuss importance of exercise as well.  She will continue Mounjaro  15 mg weekly.  She is tolerating this well.  She would like to maintain her current weight.  Preventative Healthcare: Check labs.  Shingles vaccine declined.  Flu vaccine declined.  Up-to-date on colon cancer screening.  Will be getting mammogram next month.  Patient Counseling(The following topics were reviewed and/or handout was given):  -Nutrition: Stressed importance of moderation in sodium/caffeine intake, saturated fat and cholesterol, caloric balance, sufficient intake of fresh fruits, vegetables, and fiber.  -Stressed the importance of regular exercise.   -Substance Abuse: Discussed cessation/primary prevention of tobacco, alcohol, or other drug use; driving or other dangerous activities under the influence; availability of treatment for abuse.   -Injury prevention: Discussed  safety belts, safety helmets, smoke detector, smoking near bedding or upholstery.   -Sexuality: Discussed sexually transmitted diseases, partner selection, use of condoms, avoidance of unintended pregnancy and contraceptive alternatives.   -Dental health: Discussed importance of regular tooth brushing, flossing, and dental visits.  -Health maintenance and immunizations reviewed. Please refer to Health maintenance section.  Return to care in 1 year for next preventative visit.     Subjective:  HPI:  See A/P for status of chronic conditions.  Patient is here today for annual physical.  Overall is doing well.  She has been having some sinus congestion and drainage for the last couple of weeks.  Tried over-the-counter meds with some improvement.  She still having quite a bit of facial pressure.  Lifestyle Diet: Cutting down on carbohydrates and portion sizes.  Exercise: None specific.      09/08/2023    7:55 AM  Depression screen PHQ 2/9  Decreased Interest 0  Down, Depressed, Hopeless 0  PHQ - 2 Score 0  Altered sleeping 1  Tired, decreased energy 1  Change in appetite 1  Feeling bad or failure about yourself  0  Trouble concentrating 1  Moving slowly or fidgety/restless 0  Suicidal thoughts 0  PHQ-9 Score 4  Difficult doing work/chores Not difficult at all    There are no preventive care reminders to display for this patient.   ROS: Per HPI, otherwise a complete review of systems was negative.   PMH:  The following were reviewed and entered/updated in epic: Past Medical History:  Diagnosis Date   Arthritis    knees   GERD (gastroesophageal reflux disease)    Hypertension  Seasonal allergies    SUI (stress urinary incontinence, female)    Patient Active Problem List   Diagnosis Date Noted   Stress 01/13/2023   Recurrent UTI 09/01/2022   Insomnia 09/20/2020   Depression, major, single episode, moderate (HCC) 09/19/2019   Morbid obesity (HCC) 09/19/2019    Dyslipidemia 09/16/2018   Essential hypertension 09/14/2018   Gastroesophageal reflux disease without esophagitis 09/14/2018   Menopausal symptoms 09/14/2018   Hyperglycemia 09/14/2018   Cervical radiculopathy secondary to DDD 08/25/2018   Past Surgical History:  Procedure Laterality Date   CATARACT EXTRACTION, BILATERAL     ~10 yrs ago   CHOLECYSTECTOMY     ~29 yrs ago   COLONOSCOPY  2018   DILATATION & CURETTAGE/HYSTEROSCOPY WITH MYOSURE N/A 03/24/2022   Procedure: DILATATION & CURETTAGE/HYSTEROSCOPY WITH MYOSURE;  Surgeon: Lavoie, Marie-Lyne, MD;  Location: [facility];  Service: Gynecology;  Laterality: N/A;   HYSTEROSCOPY N/A 11/17/2017   Procedure: HYSTEROSCOPY FOR REMOVAL OF IUD;  Surgeon: Georgia Blonder, MD;  Location: [facility];  Service: Gynecology;  Laterality: N/A;   KNEE SURGERY     Right knee, Dr Theotis   NASAL SEPTUM SURGERY     ~33 yrs ago   WISDOM TOOTH EXTRACTION     Late 45s per pt    Family History  Problem Relation Age of Onset   Dementia Mother    Hypertension Mother    Hypertension Father    Atrial fibrillation Father    Heart attack Brother    Dementia Maternal Grandmother    Heart disease Maternal Grandfather    Breast cancer Paternal Grandmother    Cancer Paternal Grandmother    Heart disease Paternal Grandfather    Colon cancer Neg Hx    Esophageal cancer Neg Hx    Stomach cancer Neg Hx    Rectal cancer Neg Hx     Medications- reviewed and updated Current Outpatient Medications  Medication Sig Dispense Refill   azithromycin  (ZITHROMAX ) 250 MG tablet Take 2 tabs day 1, then 1 tab daily 6 each 0   Cholecalciferol (VITAMIN D3 PO) Take 1,000 Units by mouth daily.     Cyanocobalamin  (B-12 PO) Take 1 tablet by mouth daily.     hydrochlorothiazide  (HYDRODIURIL ) 12.5 MG tablet Take 0.5 tablets (6.25 mg total) by mouth daily. 45 tablet 3   mirabegron ER (MYRBETRIQ) 50 MG TB24 tablet Take 50 mg by mouth daily.      tirzepatide  (MOUNJARO ) 15 MG/0.5ML Pen Inject 15 mg into the skin once a week. 6 mL 0   pantoprazole  (PROTONIX ) 40 MG tablet Take 1 tablet (40 mg total) by mouth 2 (two) times daily. 90 tablet 3   No current facility-administered medications for this visit.    Allergies-reviewed and updated Allergies  Allergen Reactions   Nitrofurantoin Hives and Rash   Macrodantin [Nitrofurantoin Macrocrystal] Hives   Tape     Makes skin raw - paper tape better   Tetracycline Hives   Tetracyclines & Related Hives   Bactrim [Sulfamethoxazole-Trimethoprim] Rash    Social History   Socioeconomic History   Marital status: Widowed    Spouse name: Not on file   Number of children: Not on file   Years of education: Not on file   Highest education level: 12th grade  Occupational History   Not on file  Tobacco Use   Smoking status: Never   Smokeless tobacco: Never  Vaping Use   Vaping status: Never Used  Substance and Sexual Activity  Alcohol use: Yes    Comment: SOCIAL    Drug use: Never   Sexual activity: Not Currently    Partners: Male    Birth control/protection: Post-menopausal    Comment: 1st intercourse- 16, partners-more than 5  Other Topics Concern   Not on file  Social History Narrative   Not on file   Social Drivers of Health   Financial Resource Strain: Low Risk  (01/12/2023)   Overall Financial Resource Strain (CARDIA)    Difficulty of Paying Living Expenses: Not hard at all  Food Insecurity: No Food Insecurity (01/12/2023)   Hunger Vital Sign    Worried About Running Out of Food in the Last Year: Never true    Ran Out of Food in the Last Year: Never true  Transportation Needs: No Transportation Needs (01/12/2023)   PRAPARE - Administrator, Civil Service (Medical): No    Lack of Transportation (Non-Medical): No  Physical Activity: Unknown (01/12/2023)   Exercise Vital Sign    Days of Exercise per Week: Patient declined    Minutes of Exercise per Session:  Not on file  Stress: No Stress Concern Present (01/12/2023)   [employer] of Occupational Health - Occupational Stress Questionnaire    Feeling of Stress : Only a little  Social Connections: Moderately Isolated (01/12/2023)   Social Connection and Isolation Panel [NHANES]    Frequency of Communication with Friends and Family: More than three times a week    Frequency of Social Gatherings with Friends and Family: Twice a week    Attends Religious Services: 1 to 4 times per year    Active Member of [employer] or Organizations: No    Attends Banker Meetings: Not on file    Marital Status: Widowed        Objective:  Physical Exam: BP 103/73   Pulse 91   Temp (!) 97.3 F (36.3 C) (Temporal)   Ht 5' 7 (1.702 m)   Wt 187 lb (84.8 kg)   LMP 10/17/2021 (Exact Date) Comment: spotting  SpO2 97%   BMI 29.29 kg/m   Body mass index is 29.29 kg/m. Wt Readings from Last 3 Encounters:  09/08/23 187 lb (84.8 kg)  03/26/23 203 lb (92.1 kg)  02/11/23 203 lb (92.1 kg)   Gen: NAD, resting comfortably HEENT: TMs normal bilaterally. OP clear. No thyromegaly noted.  CV: RRR with no murmurs appreciated Pulm: NWOB, CTAB with no crackles, wheezes, or rhonchi GI: Normal bowel sounds present. Soft, Nontender, Nondistended. MSK: no edema, cyanosis, or clubbing noted Skin: warm, dry Neuro: CN2-12 grossly intact. Strength 5/5 in upper and lower extremities. Reflexes symmetric and intact bilaterally.  Psych: Normal affect and thought content     Hadessah Grennan M. Kennyth, MD 09/08/2023 8:17 AM

## 2023-09-08 NOTE — Patient Instructions (Signed)
 It was very nice to see you today!  We will check blood work.  Please continue to work on diet and exercise.  Please decrease your HCTZ to 6.25 mg daily.  Monitor your blood pressure at home and check on the last in a few weeks to let us  know how this is going.  I will send in a Z-Pak.  Do not start this unless her symptoms fail to improve over the next several days  Return in about 1 year (around 09/07/2024) for Annual Physical.   Take care, Dr Kennyth  PLEASE NOTE:  If you had any lab tests, please let us  know if you have not heard back within a few days. You may see your results on mychart before we have a chance to review them but we will give you a call once they are reviewed by us .   If we ordered any referrals today, please let us  know if you have not heard from their office within the next week.   If you had any urgent prescriptions sent in today, please check with the pharmacy within an hour of our visit to make sure the prescription was transmitted appropriately.   Please try these tips to maintain a healthy lifestyle:  Eat at least 3 REAL meals and 1-2 snacks per day.  Aim for no more than 5 hours between eating.  If you eat breakfast, please do so within one hour of getting up.   Each meal should contain half fruits/vegetables, one quarter protein, and one quarter carbs (no bigger than a computer mouse)  Cut down on sweet beverages. This includes juice, soda, and sweet tea.   Drink at least 1 glass of water with each meal and aim for at least 8 glasses per day  Exercise at least 150 minutes every week.     Preventive Care 78-77 Years Old, Female Preventive care refers to lifestyle choices and visits with your health care provider that can promote health and wellness. Preventive care visits are also called wellness exams. What can I expect for my preventive care visit? Counseling Your health care provider may ask you questions about your: Medical history, including: Past  medical problems. Family medical history. Pregnancy history. Current health, including: Menstrual cycle. Method of birth control. Emotional well-being. Home life and relationship well-being. Sexual activity and sexual health. Lifestyle, including: Alcohol, nicotine or tobacco, and drug use. Access to firearms. Diet, exercise, and sleep habits. Work and work astronomer. Sunscreen use. Safety issues such as seatbelt and bike helmet use. Physical exam Your health care provider will check your: Height and weight. These may be used to calculate your BMI (body mass index). BMI is a measurement that tells if you are at a healthy weight. Waist circumference. This measures the distance around your waistline. This measurement also tells if you are at a healthy weight and may help predict your risk of certain diseases, such as type 2 diabetes and high blood pressure. Heart rate and blood pressure. Body temperature. Skin for abnormal spots. What immunizations do I need?  Vaccines are usually given at various ages, according to a schedule. Your health care provider will recommend vaccines for you based on your age, medical history, and lifestyle or other factors, such as travel or where you work. What tests do I need? Screening Your health care provider may recommend screening tests for certain conditions. This may include: Lipid and cholesterol levels. Diabetes screening. This is done by checking your blood sugar (glucose) after you  have not eaten for a while (fasting). Pelvic exam and Pap test. Hepatitis B test. Hepatitis C test. HIV (human immunodeficiency virus) test. STI (sexually transmitted infection) testing, if you are at risk. Lung cancer screening. Colorectal cancer screening. Mammogram. Talk with your health care provider about when you should start having regular mammograms. This may depend on whether you have a family history of breast cancer. BRCA-related cancer screening.  This may be done if you have a family history of breast, ovarian, tubal, or peritoneal cancers. Bone density scan. This is done to screen for osteoporosis. Talk with your health care provider about your test results, treatment options, and if necessary, the need for more tests. Follow these instructions at home: Eating and drinking  Eat a diet that includes fresh fruits and vegetables, whole grains, lean protein, and low-fat dairy products. Take vitamin and mineral supplements as recommended by your health care provider. Do not drink alcohol if: Your health care provider tells you not to drink. You are pregnant, may be pregnant, or are planning to become pregnant. If you drink alcohol: Limit how much you have to 0-1 drink a day. Know how much alcohol is in your drink. In the [place] one drink equals one 12 oz bottle of beer (355 mL), one 5 oz glass of wine (148 mL), or one 1 oz glass of hard liquor (44 mL). Lifestyle Brush your teeth every morning and night with fluoride toothpaste. Floss one time each day. Exercise for at least 30 minutes 5 or more days each week. Do not use any products that contain nicotine or tobacco. These products include cigarettes, chewing tobacco, and vaping devices, such as e-cigarettes. If you need help quitting, ask your health care provider. Do not use drugs. If you are sexually active, practice safe sex. Use a condom or other form of protection to prevent STIs. If you do not wish to become pregnant, use a form of birth control. If you plan to become pregnant, see your health care provider for a prepregnancy visit. Take aspirin only as told by your health care provider. Make sure that you understand how much to take and what form to take. Work with your health care provider to find out whether it is safe and beneficial for you to take aspirin daily. Find healthy ways to manage stress, such as: Meditation, yoga, or listening to music. Journaling. Talking to a  trusted person. Spending time with friends and family. Minimize exposure to UV radiation to reduce your risk of skin cancer. Safety Always wear your seat belt while driving or riding in a vehicle. Do not drive: If you have been drinking alcohol. Do not ride with someone who has been drinking. When you are tired or distracted. While texting. If you have been using any mind-altering substances or drugs. Wear a helmet and other protective equipment during sports activities. If you have firearms in your house, make sure you follow all gun safety procedures. Seek help if you have been physically or sexually abused. What's next? Visit your health care provider once a year for an annual wellness visit. Ask your health care provider how often you should have your eyes and teeth checked. Stay up to date on all vaccines. This information is not intended to replace advice given to you by your health care provider. Make sure you discuss any questions you have with your health care provider. Document Revised: 01/15/2021 Document Reviewed: 01/15/2021 Elsevier Patient Education  2024 [employer].

## 2023-09-08 NOTE — Assessment & Plan Note (Signed)
 Check lipids

## 2023-09-10 ENCOUNTER — Encounter: Payer: Self-pay | Admitting: Family Medicine

## 2023-09-10 NOTE — Progress Notes (Signed)
 Her potassium is low.  This may be a lab error.  Please have her come back next week to recheck.  Her cholesterol is better than last year.  Her A1c and other labs are all at goal.  She should continue to work on diet and exercise and we can recheck everything else in year.

## 2023-09-13 ENCOUNTER — Other Ambulatory Visit: Payer: Self-pay | Admitting: *Deleted

## 2023-09-13 DIAGNOSIS — E876 Hypokalemia: Secondary | ICD-10-CM

## 2023-09-13 NOTE — Telephone Encounter (Signed)
 See note

## 2023-09-13 NOTE — Telephone Encounter (Signed)
 Please make sure that she is scheduled ASAP to recheck her potassium especially if she is having symptoms.

## 2023-09-14 ENCOUNTER — Encounter: Payer: Self-pay | Admitting: Family Medicine

## 2023-09-14 ENCOUNTER — Other Ambulatory Visit (INDEPENDENT_AMBULATORY_CARE_PROVIDER_SITE_OTHER): Payer: 59

## 2023-09-14 DIAGNOSIS — E876 Hypokalemia: Secondary | ICD-10-CM | POA: Diagnosis not present

## 2023-09-14 LAB — COMPREHENSIVE METABOLIC PANEL
ALT: 13 U/L (ref 0–35)
AST: 15 U/L (ref 0–37)
Albumin: 3.9 g/dL (ref 3.5–5.2)
Alkaline Phosphatase: 56 U/L (ref 39–117)
BUN: 15 mg/dL (ref 6–23)
CO2: 30 meq/L (ref 19–32)
Calcium: 8.9 mg/dL (ref 8.4–10.5)
Chloride: 103 meq/L (ref 96–112)
Creatinine, Ser: 0.78 mg/dL (ref 0.40–1.20)
GFR: 80.03 mL/min (ref >60.00)
Glucose, Bld: 90 mg/dL (ref 70–99)
Potassium: 3.6 meq/L (ref 3.5–5.1)
Sodium: 141 meq/L (ref 135–145)
Total Bilirubin: 0.4 mg/dL (ref 0.2–1.2)
Total Protein: 6.6 g/dL (ref 6.0–8.3)

## 2023-09-14 NOTE — Progress Notes (Signed)
Her potassium is back to normal.  She should let us know if her symptoms have not improved.

## 2023-10-14 ENCOUNTER — Other Ambulatory Visit: Payer: Self-pay

## 2023-10-14 ENCOUNTER — Encounter: Payer: Self-pay | Admitting: Family Medicine

## 2023-10-14 MED ORDER — MOUNJARO 15 MG/0.5ML ~~LOC~~ SOAJ: 15.0000 mg | SUBCUTANEOUS | 0 refills | Status: DC

## 2023-10-14 NOTE — Telephone Encounter (Signed)
 Ok to refill her Mounjaro.   Ok for her to go back to a whole pill of HCTZ.  Recommend she follow-up here in a few weeks.

## 2023-11-21 ENCOUNTER — Encounter: Payer: Self-pay | Admitting: Family Medicine

## 2023-11-22 ENCOUNTER — Other Ambulatory Visit: Payer: Self-pay | Admitting: *Deleted

## 2023-11-22 MED ORDER — HYDROCHLOROTHIAZIDE 12.5 MG PO TABS: 12.5000 mg | ORAL_TABLET | Freq: Every day | ORAL | 1 refills | Status: DC

## 2024-01-11 ENCOUNTER — Encounter: Payer: Self-pay | Admitting: Family Medicine

## 2024-01-12 ENCOUNTER — Other Ambulatory Visit: Payer: Self-pay | Admitting: *Deleted

## 2024-01-12 MED ORDER — MOUNJARO 15 MG/0.5ML ~~LOC~~ SOAJ: 15.0000 mg | SUBCUTANEOUS | 0 refills | Status: DC

## 2024-03-06 ENCOUNTER — Other Ambulatory Visit: Payer: Self-pay | Admitting: Family Medicine

## 2024-04-06 ENCOUNTER — Encounter: Payer: Self-pay | Admitting: Family Medicine

## 2024-04-07 ENCOUNTER — Other Ambulatory Visit: Payer: Self-pay | Admitting: *Deleted

## 2024-04-07 MED ORDER — MOUNJARO 15 MG/0.5ML ~~LOC~~ SOAJ: 15.0000 mg | SUBCUTANEOUS | 0 refills | Status: DC

## 2024-06-24 ENCOUNTER — Encounter: Payer: Self-pay | Admitting: Family Medicine

## 2024-06-26 ENCOUNTER — Other Ambulatory Visit: Payer: Self-pay | Admitting: *Deleted

## 2024-06-26 MED ORDER — MOUNJARO 15 MG/0.5ML ~~LOC~~ SOAJ: 15.0000 mg | SUBCUTANEOUS | 0 refills | Status: DC

## 2024-08-03 ENCOUNTER — Other Ambulatory Visit: Payer: Self-pay | Admitting: Family Medicine

## 2024-09-08 ENCOUNTER — Ambulatory Visit: Payer: 59 | Admitting: Family Medicine

## 2024-09-08 ENCOUNTER — Encounter: Payer: Self-pay | Admitting: Family Medicine

## 2024-09-08 VITALS — BP 120/80 | HR 77 | Temp 96.3°F | Wt 195.2 lb

## 2024-09-08 DIAGNOSIS — Z0001 Encounter for general adult medical examination with abnormal findings: Secondary | ICD-10-CM

## 2024-09-08 DIAGNOSIS — F339 Major depressive disorder, recurrent, unspecified: Secondary | ICD-10-CM

## 2024-09-08 DIAGNOSIS — R739 Hyperglycemia, unspecified: Secondary | ICD-10-CM

## 2024-09-08 DIAGNOSIS — E785 Hyperlipidemia, unspecified: Secondary | ICD-10-CM

## 2024-09-08 DIAGNOSIS — F439 Reaction to severe stress, unspecified: Secondary | ICD-10-CM

## 2024-09-08 DIAGNOSIS — I10 Essential (primary) hypertension: Secondary | ICD-10-CM

## 2024-09-08 LAB — CBC
HCT: 39 % (ref 36.0–46.0)
Hemoglobin: 13.5 g/dL (ref 12.0–15.0)
MCHC: 34.5 g/dL (ref 30.0–36.0)
MCV: 90.1 fl (ref 78.0–100.0)
Platelets: 279 10*3/uL (ref 150.0–400.0)
RBC: 4.33 Mil/uL (ref 3.87–5.11)
RDW: 12.4 % (ref 11.5–15.5)
WBC: 4.7 10*3/uL (ref 4.0–10.5)

## 2024-09-08 LAB — COMPREHENSIVE METABOLIC PANEL WITH GFR
ALT: 9 U/L (ref 3–35)
AST: 15 U/L (ref 5–37)
Albumin: 4.3 g/dL (ref 3.5–5.2)
Alkaline Phosphatase: 53 U/L (ref 39–117)
BUN: 15 mg/dL (ref 6–23)
CO2: 33 meq/L — ABNORMAL HIGH (ref 19–32)
Calcium: 9.4 mg/dL (ref 8.4–10.5)
Chloride: 102 meq/L (ref 96–112)
Creatinine, Ser: 0.91 mg/dL (ref 0.40–1.20)
GFR: 66.06 mL/min
Glucose, Bld: 83 mg/dL (ref 70–99)
Potassium: 3.5 meq/L (ref 3.5–5.1)
Sodium: 141 meq/L (ref 135–145)
Total Bilirubin: 0.6 mg/dL (ref 0.2–1.2)
Total Protein: 6.7 g/dL (ref 6.0–8.3)

## 2024-09-08 LAB — LIPID PANEL
Cholesterol: 156 mg/dL (ref 28–200)
HDL: 52.4 mg/dL
LDL Cholesterol: 91 mg/dL (ref 10–99)
NonHDL: 103.3
Total CHOL/HDL Ratio: 3
Triglycerides: 63 mg/dL (ref 10.0–149.0)
VLDL: 12.6 mg/dL (ref 0.0–40.0)

## 2024-09-08 LAB — TSH: TSH: 1.41 u[IU]/mL (ref 0.35–5.50)

## 2024-09-08 LAB — HEMOGLOBIN A1C: Hgb A1c MFr Bld: 5 % (ref 4.6–6.5)

## 2024-09-08 MED ORDER — HYDROCHLOROTHIAZIDE 12.5 MG PO CAPS: 12.5000 mg | ORAL_CAPSULE | Freq: Every day | ORAL | 3 refills | Status: AC

## 2024-09-08 MED ORDER — PANTOPRAZOLE SODIUM 40 MG PO TBEC: 40.0000 mg | DELAYED_RELEASE_TABLET | Freq: Two times a day (BID) | ORAL | 2 refills | Status: AC

## 2024-09-08 MED ORDER — ESCITALOPRAM OXALATE 10 MG PO TABS: 10.0000 mg | ORAL_TABLET | Freq: Every day | ORAL | 3 refills | Status: AC

## 2024-09-08 MED ORDER — MOUNJARO 15 MG/0.5ML ~~LOC~~ SOAJ: 15.0000 mg | SUBCUTANEOUS | 0 refills | Status: AC

## 2024-09-08 NOTE — Assessment & Plan Note (Signed)
 She is up about 8 pounds since our last visit.  She will be working on diet and exercise.

## 2024-09-08 NOTE — Assessment & Plan Note (Signed)
 See depression A/P.  We are starting Lexapro  and she will follow-up with us  in a couple weeks.

## 2024-09-08 NOTE — Patient Instructions (Signed)
 It was very nice to see you today!  VISIT SUMMARY: During your annual physical exam, we addressed your blood pressure medication, leg swelling, weight gain, stress and anxiety, and stomach burning. We also discussed your family history of Alzheimer's disease and general health maintenance.  YOUR PLAN: ESSENTIAL HYPERTENSION: You have difficulty swallowing your current blood pressure medication. -We switched you back to the capsule form of your blood pressure medication and sent the prescription to the pharmacy.  MAJOR DEPRESSIVE DISORDER, RECURRENT, WITH STRESS: You are experiencing significant stress and anxiety due to personal circumstances. -We discussed the benefits of Lexapro  and addressed your concerns about side effects and dementia risk. -We prescribed Lexapro  once daily and encouraged you to follow up in two weeks to assess your response.  MORBID OBESITY: You have experienced recent weight gain. -Continue using Mounjaro . -We discussed the importance of exercise and resistance training and encouraged you to incorporate these into your routine.  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 -We discussed lifestyle modifications for symptom management, including exercise and leg elevation. -We advised you to reduce your sodium intake and consider using compression stockings.  GENERAL HEALTH MAINTENANCE: Routine health maintenance was discussed. -You declined pneumonia and shingles vaccines. -We ordered blood work including A1c, blood counts, and electrolytes. -We provided information for mammogram scheduling and discussed shingles and tetanus vaccines.  Return in about 1 year (around 09/08/2025) for Annual Physical.   Take care, Dr Kennyth  PLEASE NOTE:  If you had any lab tests, please let us  know if you have not heard back within a few days. You may see your results on  mychart before we have a chance to review them but we will give you a call once they are reviewed by us .   If we ordered any referrals today, please let us  know if you have not heard from their office within the next week.   If you had any urgent prescriptions sent in today, please check with the pharmacy within an hour of our visit to make sure the prescription was transmitted appropriately.   Please try these tips to maintain a healthy lifestyle:  Eat at least 3 REAL meals and 1-2 snacks per day.  Aim for no more than 5 hours between eating.  If you eat breakfast, please do so within one hour of getting up.   Each meal should contain half fruits/vegetables, one quarter protein, and one quarter carbs (no bigger than a computer mouse)  Cut down on sweet beverages. This includes juice, soda, and sweet tea.   Drink at least 1 glass of water with each meal and aim for at least 8 glasses per day  Exercise at least 150 minutes every week.    Preventive Care 66 Years and Older, Female Preventive care refers to lifestyle choices and visits with your health care provider that can promote health and wellness. Preventive care visits are also called wellness exams. What can I expect for my preventive care visit? Counseling Your health care provider may ask you questions about your: Medical history, including: Past medical problems. Family medical history. Pregnancy and menstrual history. History of falls. Current health, including: Memory and ability to understand (cognition). Emotional well-being. Home life and relationship well-being. Sexual activity and sexual health. Lifestyle, including: Alcohol, nicotine or tobacco, and drug use. Access to firearms. Diet, exercise, and sleep habits. Work and work astronomer. Sunscreen use.  Safety issues such as seatbelt and bike helmet use. Physical exam Your health care provider will check your: Height and weight. These may be used to calculate  your BMI (body mass index). BMI is a measurement that tells if you are at a healthy weight. Waist circumference. This measures the distance around your waistline. This measurement also tells if you are at a healthy weight and may help predict your risk of certain diseases, such as type 2 diabetes and high blood pressure. Heart rate and blood pressure. Body temperature. Skin for abnormal spots. What immunizations do I need?  Vaccines are usually given at various ages, according to a schedule. Your health care provider will recommend vaccines for you based on your age, medical history, and lifestyle or other factors, such as travel or where you work. What tests do I need? Screening Your health care provider may recommend screening tests for certain conditions. This may include: Lipid and cholesterol levels. Hepatitis C test. Hepatitis B test. HIV (human immunodeficiency virus) test. STI (sexually transmitted infection) testing, if you are at risk. Lung cancer screening. Colorectal cancer screening. Diabetes screening. This is done by checking your blood sugar (glucose) after you have not eaten for a while (fasting). Mammogram. Talk with your health care provider about how often you should have regular mammograms. BRCA-related cancer screening. This may be done if you have a family history of breast, ovarian, tubal, or peritoneal cancers. Bone density scan. This is done to screen for osteoporosis. Talk with your health care provider about your test results, treatment options, and if necessary, the need for more tests. Follow these instructions at home: Eating and drinking  Eat a diet that includes fresh fruits and vegetables, whole grains, lean protein, and low-fat dairy products. Limit your intake of foods with high amounts of sugar, saturated fats, and salt. Take vitamin and mineral supplements as recommended by your health care provider. Do not drink alcohol if your health care provider  tells you not to drink. If you drink alcohol: Limit how much you have to 0-1 drink a day. Know how much alcohol is in your drink. In the [place] one drink equals one 12 oz bottle of beer (355 mL), one 5 oz glass of wine (148 mL), or one 1 oz glass of hard liquor (44 mL). Lifestyle Brush your teeth every morning and night with fluoride toothpaste. Floss one time each day. Exercise for at least 30 minutes 5 or more days each week. Do not use any products that contain nicotine or tobacco. These products include cigarettes, chewing tobacco, and vaping devices, such as e-cigarettes. If you need help quitting, ask your health care provider. Do not use drugs. If you are sexually active, practice safe sex. Use a condom or other form of protection in order to prevent STIs. Take aspirin only as told by your health care provider. Make sure that you understand how much to take and what form to take. Work with your health care provider to find out whether it is safe and beneficial for you to take aspirin daily. Ask your health care provider if you need to take a cholesterol-lowering medicine (statin). Find healthy ways to manage stress, such as: Meditation, yoga, or listening to music. Journaling. Talking to a trusted person. Spending time with friends and family. Minimize exposure to UV radiation to reduce your risk of skin cancer. Safety Always wear your seat belt while driving or riding in a vehicle. Do not drive: If you have been drinking alcohol.  Do not ride with someone who has been drinking. When you are tired or distracted. While texting. If you have been using any mind-altering substances or drugs. Wear a helmet and other protective equipment during sports activities. If you have firearms in your house, make sure you follow all gun safety procedures. What's next? Visit your health care provider once a year for an annual wellness visit. Ask your health care provider how often you should have  your eyes and teeth checked. Stay up to date on all vaccines. This information is not intended to replace advice given to you by your health care provider. Make sure you discuss any questions you have with your health care provider. Document Revised: 01/15/2021 Document Reviewed: 01/15/2021 Elsevier Patient Education  2024 [employer].

## 2024-09-08 NOTE — Assessment & Plan Note (Signed)
 Blood pressure at goal today though she would like to switch back to the HCTZ capsule as it was easy for her to take.  Will send new prescription in today.  Check labs.

## 2024-09-08 NOTE — Assessment & Plan Note (Signed)
 Check lipids with labs.  Discussed lifestyle interventions.

## 2024-09-08 NOTE — Progress Notes (Signed)
 "  Chief Complaint:  Karen Webster is a 64 y.o. female who presents today for her annual comprehensive physical exam.    Assessment/Plan:  Chronic Problems Addressed Today: Depression, recurrent Lengthy discussion with patient today regarding her increased stress and depression over the last several months.  She has had several life stressor recently including declining health of her long-term partner as well as her father.  She has elevated PHQ score today.  Symptoms are not well-controlled.  We discussed treatment options including medication versus referral to see a therapist.  She does not think she has time to see a therapist at this point though is agreeable to start medication.  She has previously been on Lexapro  and has done well with this.  Will restart at 10 mg daily.  She is aware of potential side effects.  She will follow-up with us  in a few weeks via MyChart and we can adjust as needed.  Essential hypertension Blood pressure at goal today though she would like to switch back to the HCTZ capsule as it was easy for her to take.  Will send new prescription in today.  Check labs.  Hyperglycemia Check A1c with labs.  She is on Mounjaro  15 mg weekly.  Dyslipidemia Check lipids with labs.  Discussed lifestyle interventions.  Morbid obesity (HCC) She is up about 8 pounds since our last visit.  She will be working on diet and exercise.  Stress See depression A/P.  We are starting Lexapro  and she will follow-up with us  in a couple weeks.  Preventative Healthcare: Check labs.  Vaccines declined.  She will get mammogram soon.  Deferred cervical cancer screening.  Due for colonoscopy in 2029.   Patient Counseling(The following topics were reviewed and/or handout was given):  -Nutrition: Stressed importance of moderation in sodium/caffeine intake, saturated fat and cholesterol, caloric balance, sufficient intake of fresh fruits, vegetables, and fiber.  -Stressed the importance of  regular exercise.   -Substance Abuse: Discussed cessation/primary prevention of tobacco, alcohol, or other drug use; driving or other dangerous activities under the influence; availability of treatment for abuse.   -Injury prevention: Discussed safety belts, safety helmets, smoke detector, smoking near bedding or upholstery.   -Sexuality: Discussed sexually transmitted diseases, partner selection, use of condoms, avoidance of unintended pregnancy and contraceptive alternatives.   -Dental health: Discussed importance of regular tooth brushing, flossing, and dental visits.  -Health maintenance and immunizations reviewed. Please refer to Health maintenance section.  Return to care in 1 year for next preventative visit.     Subjective:  HPI:  She has no acute complaints today. Patient is here today for her annual physical.  See assessment / plan for status of chronic conditions.  Discussed the use of AI scribe software for clinical note transcription with the patient, who gave verbal consent to proceed.  History of Present Illness Karen Webster is a 64 year old female who presents for an annual physical exam.  She is experiencing difficulty with her current blood pressure medication, hydrochlorothiazide  (HCTZ), due to its small size, making it hard to swallow. She previously switched from a capsule to a pill to try splitting it, but finds it too small to cut. She requests to switch back to the capsule form. She mentions that a combination medication, Dyazide, previously made her blood pressure too low.  She reports swelling in her leg, which is tender and puffy, particularly at the end of the day. She is unsure if the swelling in her  leg has worsened since changing her blood pressure medication.  She has gained approximately eight pounds over the past year, attributing this to a lack of exercise and a diet that is not as good as it was previously. She acknowledges the need to  resume exercise, noting that her hip pain worsens with inactivity.  She is experiencing significant stress and anxiety due to personal circumstances, including her father's diagnosis of afib and her boyfriend's recent diagnosis of rectal cancer. She describes feeling overwhelmed and anxious, with a lot of 'pain' and stress in her life. She previously took Lexapro  during a difficult period following her husband's death from cancer.  She reports burning in her stomach, which she attributes to stress. She is currently taking Protonix , an acid blocker.  She mentions her family history of Alzheimer's disease, with her mother, grandmother, and aunt having had the condition. She expresses concern about her own risk, given her age and family history. Her daughter, aged 77, has had shingles multiple times, which influenced her decision to receive the shingles vaccine despite experiencing pain from the first dose.        09/08/2023    7:55 AM  Depression screen PHQ 2/9  Decreased Interest 0  Down, Depressed, Hopeless 0  PHQ - 2 Score 0  Altered sleeping 1  Tired, decreased energy 1  Change in appetite 1  Feeling bad or failure about yourself  0  Trouble concentrating 1  Moving slowly or fidgety/restless 0  Suicidal thoughts 0  PHQ-9 Score 4   Difficult doing work/chores Not difficult at all     Data saved with a previous flowsheet row definition    Health Maintenance Due  Topic Date Due   HIV Screening  Never done   Hepatitis C Screening  Never done   Pneumococcal Vaccine: 50+ Years (1 of 1 - PCV) Never done   Zoster Vaccines- Shingrix  (2 of 2) 10/27/2022   DTaP/Tdap/Td (2 - Td or Tdap) 08/16/2023   Mammogram  10/25/2023   Cervical Cancer Screening (HPV/Pap Cotest)  11/09/2023   Bone Density Scan  Never done     ROS: Per HPI, otherwise a complete review of systems was negative.   PMH:  The following were reviewed and entered/updated in epic: Past Medical History:  Diagnosis Date    Arthritis    knees   GERD (gastroesophageal reflux disease)    Hypertension    Seasonal allergies    SUI (stress urinary incontinence, female)    Patient Active Problem List   Diagnosis Date Noted   Stress 01/13/2023   Recurrent UTI 09/01/2022   Insomnia 09/20/2020   Depression, recurrent 09/19/2019   Morbid obesity (HCC) 09/19/2019   Dyslipidemia 09/16/2018   Essential hypertension 09/14/2018   Gastroesophageal reflux disease without esophagitis 09/14/2018   Menopausal symptoms 09/14/2018   Hyperglycemia 09/14/2018   Cervical radiculopathy secondary to DDD 08/25/2018   Past Surgical History:  Procedure Laterality Date   CATARACT EXTRACTION, BILATERAL     ~10 yrs ago   CHOLECYSTECTOMY     ~29 yrs ago   COLONOSCOPY  2018   DILATATION & CURETTAGE/HYSTEROSCOPY WITH MYOSURE N/A 03/24/2022   Procedure: DILATATION & CURETTAGE/HYSTEROSCOPY WITH MYOSURE;  Surgeon: Lavoie, Marie-Lyne, MD;  Location: [facility] [place];  Service: Gynecology;  Laterality: N/A;   HYSTEROSCOPY N/A 11/17/2017   Procedure: HYSTEROSCOPY FOR REMOVAL OF IUD;  Surgeon: Georgia Blonder, MD;  Location: [facility];  Service: Gynecology;  Laterality: N/A;   KNEE SURGERY  Right knee, Dr Theotis   NASAL SEPTUM SURGERY     ~33 yrs ago   WISDOM TOOTH EXTRACTION     Late 63s per pt    Family History  Problem Relation Age of Onset   Dementia Mother    Hypertension Mother    Hypertension Father    Atrial fibrillation Father    Heart attack Brother    Dementia Maternal Grandmother    Heart disease Maternal Grandfather    Breast cancer Paternal Grandmother    Cancer Paternal Grandmother    Heart disease Paternal Grandfather    Colon cancer Neg Hx    Esophageal cancer Neg Hx    Stomach cancer Neg Hx    Rectal cancer Neg Hx     Medications- reviewed and updated Current Outpatient Medications  Medication Sig Dispense Refill   escitalopram  (LEXAPRO ) 10 MG tablet Take 1 tablet  (10 mg total) by mouth daily. 90 tablet 3   hydrochlorothiazide  (MICROZIDE ) 12.5 MG capsule Take 1 capsule (12.5 mg total) by mouth daily. 90 capsule 3   Cholecalciferol (VITAMIN D3 PO) Take 1,000 Units by mouth daily.     Cyanocobalamin  (B-12 PO) Take 1 tablet by mouth daily.     pantoprazole  (PROTONIX ) 40 MG tablet Take 1 tablet (40 mg total) by mouth 2 (two) times daily. 180 tablet 2   tirzepatide  (MOUNJARO ) 15 MG/0.5ML Pen Inject 15 mg into the skin once a week. 6 mL 0   No current facility-administered medications for this visit.    Allergies-reviewed and updated Allergies[1]  Social History   Socioeconomic History   Marital status: Widowed    Spouse name: Not on file   Number of children: Not on file   Years of education: Not on file   Highest education level: 12th grade  Occupational History   Not on file  Tobacco Use   Smoking status: Never   Smokeless tobacco: Never  Vaping Use   Vaping status: Never Used  Substance and Sexual Activity   Alcohol use: Yes    Comment: SOCIAL    Drug use: Never   Sexual activity: Not Currently    Partners: Male    Birth control/protection: Post-menopausal    Comment: 1st intercourse- 16, partners-more than 5  Other Topics Concern   Not on file  Social History Narrative   Not on file   Social Drivers of Health   Tobacco Use: Low Risk (09/08/2024)   Patient History    Smoking Tobacco Use: Never    Smokeless Tobacco Use: Never    Passive Exposure: Not on file  Financial Resource Strain: Low Risk (01/12/2023)   Overall Financial Resource Strain (CARDIA)    Difficulty of Paying Living Expenses: Not hard at all  Food Insecurity: No Food Insecurity (01/12/2023)   Hunger Vital Sign    Worried About Running Out of Food in the Last Year: Never true    Ran Out of Food in the Last Year: Never true  Transportation Needs: No Transportation Needs (01/12/2023)   PRAPARE - Administrator, Civil Service (Medical): No    Lack of  Transportation (Non-Medical): No  Physical Activity: Unknown (01/12/2023)   Exercise Vital Sign    Days of Exercise per Week: Patient declined    Minutes of Exercise per Session: Not on file  Stress: No Stress Concern Present (01/12/2023)   [employer] of Occupational Health - Occupational Stress Questionnaire    Feeling of Stress : Only a little  Social  Connections: Moderately Isolated (01/12/2023)   Social Connection and Isolation Panel    Frequency of Communication with Friends and Family: More than three times a week    Frequency of Social Gatherings with Friends and Family: Twice a week    Attends Religious Services: 1 to 4 times per year    Active Member of [employer] or Organizations: No    Attends Banker Meetings: Not on file    Marital Status: Widowed  Depression (PHQ2-9): Low Risk (09/08/2023)   Depression (PHQ2-9)    PHQ-2 Score: 4  Alcohol Screen: Low Risk (01/12/2023)   Alcohol Screen    Last Alcohol Screening Score (AUDIT): 3  Housing: Low Risk (01/12/2023)   Housing    Last Housing Risk Score: 0  Utilities: Not on file  Health Literacy: Not on file        Objective:  Physical Exam: BP 120/80   Pulse 77   Temp (!) 96.3 F (35.7 C) (Temporal)   Wt 195 lb 3.2 oz (88.5 kg)   LMP 10/17/2021 Comment: spotting  SpO2 94%   BMI 30.57 kg/m   Body mass index is 30.57 kg/m. Wt Readings from Last 3 Encounters:  09/08/24 195 lb 3.2 oz (88.5 kg)  09/08/23 187 lb (84.8 kg)  03/26/23 203 lb (92.1 kg)   Gen: NAD, resting comfortably HEENT: TMs normal bilaterally. OP clear. No thyromegaly noted.  CV: RRR with no murmurs appreciated Pulm: NWOB, CTAB with no crackles, wheezes, or rhonchi GI: Normal bowel sounds present. Soft, Nontender, Nondistended. MSK: no edema, cyanosis, or clubbing noted Skin: warm, dry Neuro: CN2-12 grossly intact. Strength 5/5 in upper and lower extremities. Reflexes symmetric and intact bilaterally.  Psych: Normal affect and thought  content     Terilynn Buresh M. Kennyth, MD 09/08/2024 8:29 AM     [1]  Allergies Allergen Reactions   Nitrofurantoin Hives and Rash   Macrodantin [Nitrofurantoin Macrocrystal] Hives   Tape     Makes skin raw - paper tape better   Tetracycline Hives   Tetracyclines & Related Hives   Bactrim [Sulfamethoxazole-Trimethoprim] Rash   "

## 2024-09-08 NOTE — Assessment & Plan Note (Signed)
 Lengthy discussion with patient today regarding her increased stress and depression over the last several months.  She has had several life stressor recently including declining health of her long-term partner as well as her father.  She has elevated PHQ score today.  Symptoms are not well-controlled.  We discussed treatment options including medication versus referral to see a therapist.  She does not think she has time to see a therapist at this point though is agreeable to start medication.  She has previously been on Lexapro  and has done well with this.  Will restart at 10 mg daily.  She is aware of potential side effects.  She will follow-up with us  in a few weeks via MyChart and we can adjust as needed.

## 2024-09-08 NOTE — Assessment & Plan Note (Signed)
 Check A1c with labs.  She is on Mounjaro  15 mg weekly.

## 2025-09-11 ENCOUNTER — Encounter: Admitting: Family Medicine
